# Patient Record
Sex: Female | Born: 1995 | Race: White | Hispanic: No | Marital: Single | Smoking: Never smoker
Health system: Southern US, Community
[De-identification: ages and names within clinical notes are randomized; demographics above are authoritative.]

## PROBLEM LIST (undated history)

## (undated) DIAGNOSIS — A64 Unspecified sexually transmitted disease: Secondary | ICD-10-CM

## (undated) DIAGNOSIS — Z8619 Personal history of other infectious and parasitic diseases: Secondary | ICD-10-CM

## (undated) DIAGNOSIS — N946 Dysmenorrhea, unspecified: Secondary | ICD-10-CM

## (undated) HISTORY — DX: Dysmenorrhea, unspecified: N94.6

## (undated) HISTORY — DX: Personal history of other infectious and parasitic diseases: Z86.19

## (undated) HISTORY — DX: Unspecified sexually transmitted disease: A64

---
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
## 2015-05-27 DIAGNOSIS — Z3041 Encounter for surveillance of contraceptive pills: Secondary | ICD-10-CM | POA: Diagnosis not present

## 2015-05-27 DIAGNOSIS — Z6823 Body mass index (BMI) 23.0-23.9, adult: Secondary | ICD-10-CM | POA: Diagnosis not present

## 2015-05-27 DIAGNOSIS — Z01419 Encounter for gynecological examination (general) (routine) without abnormal findings: Secondary | ICD-10-CM | POA: Diagnosis not present

## 2015-08-22 DIAGNOSIS — K080 Exfoliation of teeth due to systemic causes: Secondary | ICD-10-CM | POA: Diagnosis not present

## 2015-08-26 DIAGNOSIS — K080 Exfoliation of teeth due to systemic causes: Secondary | ICD-10-CM | POA: Diagnosis not present

## 2016-01-19 DIAGNOSIS — H6503 Acute serous otitis media, bilateral: Secondary | ICD-10-CM | POA: Diagnosis not present

## 2016-01-19 DIAGNOSIS — J029 Acute pharyngitis, unspecified: Secondary | ICD-10-CM | POA: Diagnosis not present

## 2016-01-19 DIAGNOSIS — B9789 Other viral agents as the cause of diseases classified elsewhere: Secondary | ICD-10-CM | POA: Diagnosis not present

## 2016-01-19 DIAGNOSIS — J069 Acute upper respiratory infection, unspecified: Secondary | ICD-10-CM | POA: Diagnosis not present

## 2016-02-23 DIAGNOSIS — K080 Exfoliation of teeth due to systemic causes: Secondary | ICD-10-CM | POA: Diagnosis not present

## 2016-06-18 DIAGNOSIS — Z1151 Encounter for screening for human papillomavirus (HPV): Secondary | ICD-10-CM | POA: Diagnosis not present

## 2016-06-18 DIAGNOSIS — A749 Chlamydial infection, unspecified: Secondary | ICD-10-CM | POA: Insufficient documentation

## 2016-06-18 DIAGNOSIS — Z3041 Encounter for surveillance of contraceptive pills: Secondary | ICD-10-CM | POA: Diagnosis not present

## 2016-06-18 DIAGNOSIS — Z1389 Encounter for screening for other disorder: Secondary | ICD-10-CM | POA: Diagnosis not present

## 2016-06-18 DIAGNOSIS — Z6822 Body mass index (BMI) 22.0-22.9, adult: Secondary | ICD-10-CM | POA: Diagnosis not present

## 2016-06-18 DIAGNOSIS — Z113 Encounter for screening for infections with a predominantly sexual mode of transmission: Secondary | ICD-10-CM | POA: Diagnosis not present

## 2016-06-18 DIAGNOSIS — Z130 Encounter for screening for diseases of the blood and blood-forming organs and certain disorders involving the immune mechanism: Secondary | ICD-10-CM | POA: Diagnosis not present

## 2016-06-18 DIAGNOSIS — Z124 Encounter for screening for malignant neoplasm of cervix: Secondary | ICD-10-CM | POA: Diagnosis not present

## 2016-06-18 DIAGNOSIS — Z01419 Encounter for gynecological examination (general) (routine) without abnormal findings: Secondary | ICD-10-CM | POA: Diagnosis not present

## 2016-06-18 LAB — HM PAP SMEAR: HM Pap smear: NEGATIVE

## 2016-06-26 DIAGNOSIS — D225 Melanocytic nevi of trunk: Secondary | ICD-10-CM | POA: Diagnosis not present

## 2016-06-26 DIAGNOSIS — D2371 Other benign neoplasm of skin of right lower limb, including hip: Secondary | ICD-10-CM | POA: Diagnosis not present

## 2016-06-26 DIAGNOSIS — D235 Other benign neoplasm of skin of trunk: Secondary | ICD-10-CM | POA: Diagnosis not present

## 2016-06-26 DIAGNOSIS — D485 Neoplasm of uncertain behavior of skin: Secondary | ICD-10-CM | POA: Diagnosis not present

## 2016-07-04 DIAGNOSIS — R229 Localized swelling, mass and lump, unspecified: Secondary | ICD-10-CM | POA: Diagnosis not present

## 2016-07-04 DIAGNOSIS — B078 Other viral warts: Secondary | ICD-10-CM | POA: Diagnosis not present

## 2016-07-06 ENCOUNTER — Other Ambulatory Visit (HOSPITAL_COMMUNITY)
Admission: RE | Admit: 2016-07-06 | Discharge: 2016-07-06 | Disposition: A | Discharge status: Home / Self Care | Payer: Federal, State, Local not specified - PPO | Source: Ambulatory Visit | Attending: Family Medicine | Admitting: Family Medicine

## 2016-07-06 ENCOUNTER — Encounter: Payer: Self-pay | Admitting: Family Medicine

## 2016-07-06 ENCOUNTER — Ambulatory Visit (INDEPENDENT_AMBULATORY_CARE_PROVIDER_SITE_OTHER): Payer: Federal, State, Local not specified - PPO | Admitting: Family Medicine

## 2016-07-06 VITALS — BP 124/78 | HR 67 | Temp 98.2°F | Ht 65.75 in | Wt 132.8 lb

## 2016-07-06 DIAGNOSIS — E049 Nontoxic goiter, unspecified: Secondary | ICD-10-CM | POA: Diagnosis not present

## 2016-07-06 DIAGNOSIS — R599 Enlarged lymph nodes, unspecified: Secondary | ICD-10-CM

## 2016-07-06 DIAGNOSIS — A749 Chlamydial infection, unspecified: Secondary | ICD-10-CM

## 2016-07-06 DIAGNOSIS — Z7689 Persons encountering health services in other specified circumstances: Secondary | ICD-10-CM | POA: Diagnosis not present

## 2016-07-06 DIAGNOSIS — Z8619 Personal history of other infectious and parasitic diseases: Secondary | ICD-10-CM | POA: Diagnosis not present

## 2016-07-06 LAB — CBC WITH DIFFERENTIAL/PLATELET
BASOS PCT: 0.5 % (ref 0.0–3.0)
Basophils Absolute: 0 10*3/uL (ref 0.0–0.1)
EOS ABS: 0.1 10*3/uL (ref 0.0–0.7)
EOS PCT: 1.3 % (ref 0.0–5.0)
HEMATOCRIT: 42.4 % (ref 36.0–46.0)
HEMOGLOBIN: 14.7 g/dL (ref 12.0–15.0)
LYMPHS PCT: 29.7 % (ref 12.0–46.0)
Lymphs Abs: 1.4 10*3/uL (ref 0.7–4.0)
MCHC: 34.6 g/dL (ref 30.0–36.0)
MCV: 90.9 fl (ref 78.0–100.0)
MONOS PCT: 7.7 % (ref 3.0–12.0)
Monocytes Absolute: 0.4 10*3/uL (ref 0.1–1.0)
NEUTROS ABS: 2.9 10*3/uL (ref 1.4–7.7)
Neutrophils Relative %: 60.8 % (ref 43.0–77.0)
PLATELETS: 196 10*3/uL (ref 150.0–400.0)
RBC: 4.67 Mil/uL (ref 3.87–5.11)
RDW: 13.3 % (ref 11.5–15.5)
WBC: 4.7 10*3/uL (ref 4.0–10.5)

## 2016-07-06 LAB — T3, FREE: T3, Free: 3.6 pg/mL (ref 2.3–4.2)

## 2016-07-06 LAB — TSH: TSH: 1.75 u[IU]/mL (ref 0.35–4.50)

## 2016-07-06 LAB — T4, FREE: Free T4: 0.87 ng/dL (ref 0.60–1.60)

## 2016-07-06 NOTE — Patient Instructions (Signed)
It was a pleasure to meet you today! I look forward to partnering with you for your health care needs  Please schedule a follow up lab only visit for 2 weeks  I will notify you when lab results are back  If you have not heard from us about scheduling your ultrasound in 2 business days, please call the office

## 2016-07-06 NOTE — Progress Notes (Signed)
Subjective:    Patient ID: Christine Griffin, female    DOB: 1995-05-05, 21 y.o.   MRN: [id number]  HPI This is a 21 yo female who presents today to establish care. She is from [place], [place]. She recently graduated from [employer] and works for the [employer]. She has had some stress with moving to a new city and with recent health concerns. Has made some friends. Sleeping well.   Gyn/chlamydia- She was seen by gyn 06/18/16 and had negative PAP, negative urine dip, Hgb 13.6, negative gonorrhea/trich, positive chlamydia. She was treated with azithromycin 1000 mg x 1 and continued on OCP, Daysee. Declined testing for HIV/RPR at that time, would like testing today. Told her partner, has not seen him since. Immunizations, including HPV, up to date.   Thyroid- Was noted to have enlarged thyroid gland by gyn. Mother was diagnosed several years ago with thyroid cancer. Christine Griffin denies any weight change, difficulty swallowing, change in energy level, change in bowel habits.   Enlarged lymph node- she noticed an enlarged lymph node behind left ear, this has resolved. Thinks she has enlarged node under left side of jaw.   Regular dental care Exercises regularly  No past medical history on file. Past Surgical History:  Procedure Laterality Date  . KNEE SURGERY Right 2013   Family History  Problem Relation Age of Onset  . Cancer Mother        Thyroid   . Cancer Father        Skin   Social History  Substance Use Topics  . Smoking status: Never Smoker  . Smokeless tobacco: Never Used  . Alcohol use Yes     Comment: social         Review of Systems Per HPI    Objective:   Physical Exam  Constitutional: She is oriented to person, place, and time. She appears well-developed and well-nourished. No distress.  HENT:  Head: Normocephalic and atraumatic.  Mouth/Throat: Oropharynx is clear and moist.  Eyes: Conjunctivae are normal.  Neck: Normal range of motion. Neck  supple. Thyromegaly: right sided fullness visualized, no nodule/mass palpated, thyroid moves normally with swallowing.    Cardiovascular: Normal rate, regular rhythm and normal heart sounds.   Pulmonary/Chest: Effort normal and breath sounds normal.  Neurological: She is alert and oriented to person, place, and time.  Skin: Skin is warm and dry. She is not diaphoretic.  Psychiatric: She has a normal mood and affect. Her behavior is normal. Judgment and thought content normal.  Vitals reviewed.     BP 124/78 (BP Location: Right Arm, Patient Position: Sitting, Cuff Size: Normal)   Pulse 67   Temp 98.2 F (36.8 C) (Oral)   Ht 5' 5.75" (1.67 m)   Wt 132 lb 12.8 oz (60.2 kg)   LMP 07/01/2016 (Exact Date)   SpO2 99%   BMI 21.60 kg/m      Assessment & Plan:  1. Encounter to establish care - Discussed and encouraged healthy lifestyle choices- adequate sleep, regular exercise, stress management and healthy food choices.   2. Enlarged thyroid - given family history and visualized asymmetry, will get [place] - CBC with Differential/Platelet - TSH - T4, free - T3, free - US THYROID; Future  3. Chlamydia - HIV antibody - RPR - Urine cytology ancillary only; Future- recheck in 2 weeks  4. Swollen lymph nodes - likely reactive, discussed RTC precautions - CBC with Differential/Platelet   Olean Ree, FNP-BC  [facility] [place], [facility]  07/06/2016 9:06 AM

## 2016-07-07 LAB — RPR

## 2016-07-07 LAB — HIV ANTIBODY (ROUTINE TESTING W REFLEX): HIV 1&2 Ab, 4th Generation: NONREACTIVE

## 2016-07-09 LAB — URINE CYTOLOGY ANCILLARY ONLY
Chlamydia: NEGATIVE
NEISSERIA GONORRHEA: NEGATIVE
TRICH (WINDOWPATH): NEGATIVE

## 2016-07-18 ENCOUNTER — Ambulatory Visit
Admission: RE | Admit: 2016-07-18 | Discharge: 2016-07-18 | Disposition: A | Discharge status: Home / Self Care | Payer: Federal, State, Local not specified - PPO | Source: Ambulatory Visit | Attending: Family Medicine | Admitting: Family Medicine

## 2016-07-18 DIAGNOSIS — E049 Nontoxic goiter, unspecified: Secondary | ICD-10-CM

## 2016-07-18 DIAGNOSIS — E042 Nontoxic multinodular goiter: Secondary | ICD-10-CM | POA: Diagnosis not present

## 2016-07-20 ENCOUNTER — Other Ambulatory Visit (HOSPITAL_COMMUNITY)
Admission: RE | Admit: 2016-07-20 | Discharge: 2016-07-20 | Disposition: A | Discharge status: Home / Self Care | Payer: Federal, State, Local not specified - PPO | Source: Ambulatory Visit | Attending: Family Medicine | Admitting: Family Medicine

## 2016-07-20 ENCOUNTER — Other Ambulatory Visit: Payer: Federal, State, Local not specified - PPO

## 2016-07-20 ENCOUNTER — Other Ambulatory Visit: Payer: Self-pay | Admitting: Family Medicine

## 2016-07-20 DIAGNOSIS — Z8619 Personal history of other infectious and parasitic diseases: Secondary | ICD-10-CM | POA: Diagnosis not present

## 2016-07-20 DIAGNOSIS — Z113 Encounter for screening for infections with a predominantly sexual mode of transmission: Secondary | ICD-10-CM

## 2016-07-23 LAB — URINE CYTOLOGY ANCILLARY ONLY
Chlamydia: NEGATIVE
NEISSERIA GONORRHEA: NEGATIVE
TRICH (WINDOWPATH): NEGATIVE

## 2016-08-20 ENCOUNTER — Ambulatory Visit (INDEPENDENT_AMBULATORY_CARE_PROVIDER_SITE_OTHER): Payer: Federal, State, Local not specified - PPO | Admitting: Family Medicine

## 2016-08-20 ENCOUNTER — Encounter: Payer: Self-pay | Admitting: Family Medicine

## 2016-08-20 ENCOUNTER — Other Ambulatory Visit (HOSPITAL_COMMUNITY)
Admission: RE | Admit: 2016-08-20 | Discharge: 2016-08-20 | Disposition: A | Discharge status: Home / Self Care | Payer: Federal, State, Local not specified - PPO | Source: Ambulatory Visit | Attending: Family Medicine | Admitting: Family Medicine

## 2016-08-20 VITALS — BP 124/76 | HR 75 | Temp 98.5°F | Wt 135.2 lb

## 2016-08-20 DIAGNOSIS — A549 Gonococcal infection, unspecified: Secondary | ICD-10-CM

## 2016-08-20 DIAGNOSIS — Z113 Encounter for screening for infections with a predominantly sexual mode of transmission: Secondary | ICD-10-CM

## 2016-08-20 DIAGNOSIS — B373 Candidiasis of vulva and vagina: Secondary | ICD-10-CM | POA: Diagnosis not present

## 2016-08-20 DIAGNOSIS — N898 Other specified noninflammatory disorders of vagina: Secondary | ICD-10-CM | POA: Insufficient documentation

## 2016-08-20 DIAGNOSIS — Z9189 Other specified personal risk factors, not elsewhere classified: Secondary | ICD-10-CM | POA: Diagnosis not present

## 2016-08-20 NOTE — Patient Instructions (Addendum)
I will notify you of results The following was added for the patient after results were received.    You also had some vaginal yeast- after a couple of days, you can use an over the counter 3 day vaginal treatment.    Gonorrhea Gonorrhea is a sexually transmitted disease (STD) that can affect both men and women. If left untreated, this infection can:  Damage the female or female organs.  Cause women and men to be unable to have children (be sterile).  Harm a fetus if an infected woman is pregnant.  It is important to get treatment for gonorrhea as soon as possible. It is also necessary for all of your sexual partners to be tested for the infection. What are the causes? This condition is caused by bacteria called Neisseria gonorrhoeae. The infection is spread from person to person through sexual contact, including oral, anal, and vaginal sex. A newborn can contract the infection from his or her mother during birth. What increases the risk? The following factors may make you more likely to develop this condition:  Being a woman who is younger than 23 years of age and who is sexually active.  Being a woman 58 years of age or older who has: ? A new sex partner. ? More than one sex partner. ? A sex partner who has an STD.  Being a man who has: ? A new sex partner. ? More than one sex partner. ? A sex partner who has an STD.  Using condoms inconsistently.  Currently having, or having previously had, an STD.  Exchanging sex for money or drugs.  What are the signs or symptoms? Some people do not have any symptoms. If you do have symptoms, they may be different for females and males. For females  Pain in the lower abdomen.  Abnormal vaginal discharge. The discharge may be cloudy, thick, or yellow-green in color.  Bleeding between periods.  Painful sex.  Burning or itching in and around the vagina.  Pain or burning when urinating.  Irritation, pain, bleeding, or discharge  from the rectum. This may occur if the infection was spread by anal sex.  Sore throat or swollen lymph nodes in the neck. This may occur if the infection was spread by oral sex. For males  Abnormal discharge from the penis. This discharge may be cloudy, thick, or yellow-green in color.  Pain or burning during urination.  Pain or swelling in the testicles.  Irritation, pain, bleeding, or discharge from the rectum. This may occur if the infection was spread by anal sex.  Sore throat, fever, or swollen lymph nodes in the neck. This may occur if the infection was spread by oral sex. How is this diagnosed? This condition is diagnosed based on:  A physical exam.  A sample of discharge that is examined under a microscope to look for the bacteria. The discharge may be taken from the urethra, cervix, throat, or rectum.  Urine tests.  Not all of test results will be available during your visit. How is this treated? This condition is treated with antibiotic medicines. It is important for treatment to begin as soon as possible. Early treatment may prevent some problems from developing. Do not have sex during treatment. Avoid all types of sexual activity for 7 days after treatment is complete and until any sex partners have been treated. Follow these instructions at home:  Take over-the-counter and prescription medicines only as told by your health care provider.  Take your antibiotic  medicine as told by your health care provider. Do not stop taking the antibiotic even if you start to feel better.  Do not have sex until at least 7 days after you and your partner(s) have finished treatment and your health care provider says it is okay.  It is your responsibility to get your test results. Ask your health care provider, or the department performing the test, when your results will be ready.  If you test positive for gonorrhea, inform your recent sexual partners. This includes any oral, anal, or  vaginal sex partners. They need to be checked for gonorrhea even if they do not have symptoms. They may need treatment, even if they test negative for gonorrhea.  Keep all follow-up visits as told by your health care provider. This is important. How is this prevented?  Use latex condoms correctly every time you have sexual intercourse.  Ask if your sexual partner has been tested for STDs and had negative results.  Avoid having multiple sexual partners. Contact a health care provider if:  You develop a bad reaction to the medicine you were prescribed. This may include: ? A rash. ? Nausea. ? Vomiting. ? Diarrhea.  Your symptoms do not get better after a few days of taking antibiotics.  Your symptoms get worse.  You develop new symptoms.  Your pain gets worse.  You have a fever.  You develop pain, itching, or discharge around the eyes. Get help right away if:  You feel dizzy or faint.  You have trouble breathing or have shortness of breath.  You develop an irregular heartbeat.  You have severe abdominal pain with or without shoulder pain.  You develop any bumps or sores (lesions) on your skin.  You develop warmth, redness, pain, or swelling around your joints, such as the knee. Summary  Gonorrhea is an STDthat can affect both men and women.  This condition is caused by bacteria called Neisseria gonorrhoeae. The infection is spread from person to person, usually through sexual contact, including oral, anal, and vaginal sex.  Symptoms vary between males and females. Generally, they include abnormal discharge and burning during urination. Women may also experience painful sex, itching around the vagina, and bleeding between menstrual periods. Men may also experience swelling of the testicles.  This condition is treated with antibiotic medicines. Do not have sex until at least 7 days after completing antibiotic treatment.  If left untreated, gonorrhea can have serious  side effects and complications. This information is not intended to replace advice given to you by your health care provider. Make sure you discuss any questions you have with your health care provider. Document Released: 12/23/1999 Document Revised: 11/25/2015 Document Reviewed: 11/25/2015 Elsevier Interactive Patient Education  2017 [employer]

## 2016-08-20 NOTE — Progress Notes (Signed)
   Subjective:    Patient ID: Christine Griffin, female    DOB: 1995/02/24, 21 y.o.   MRN: [id number]  HPI This is a 21 yo female who presents today requesting testing for STI. She had had unprotected intercourse 2-3 weeks ago. Has noticed some irregular discharge in the last couple of days. Yellowish discharge. No abdominal pain, no dysuria, no fever/chills.  Was diagnosed and treated for chlamydia  6/18, follow up testing was negative. Same partner. He told her he had been tested and was negative and did not undergo treatment. She reports that they started using a condom, but "it was going anywhere," so her partner removed it.    No past medical history on file. Past Surgical History:  Procedure Laterality Date  . KNEE SURGERY Right 2013   Family History  Problem Relation Age of Onset  . Cancer Mother        Thyroid   . Cancer Father        Skin   Social History  Substance Use Topics  . Smoking status: Never Smoker  . Smokeless tobacco: Never Used  . Alcohol use Yes     Comment: social       Review of Systems    per HPI Objective:   Physical Exam  Constitutional: She is oriented to person, place, and time. She appears well-developed and well-nourished.  HENT:  Head: Normocephalic and atraumatic.  Eyes: Conjunctivae are normal.  Cardiovascular: Normal rate.   Pulmonary/Chest: Effort normal.  Genitourinary: Pelvic exam was performed with patient supine. There is no rash, tenderness, lesion or injury on the right labia. There is no rash, tenderness, lesion or injury on the left labia. Cervix exhibits discharge (thick, white). Vaginal discharge found.  Neurological: She is alert and oriented to person, place, and time.  Skin: Skin is warm and dry.  Psychiatric: She has a normal mood and affect. Her behavior is normal. Judgment and thought content normal.      BP 124/76 (BP Location: Right Arm, Patient Position: Sitting, Cuff Size: Normal)   Pulse 75   Temp 98.5 F (36.9  C) (Oral)   Wt 135 lb 3.2 oz (61.3 kg)   LMP 07/10/2016   SpO2 99%   BMI 21.99 kg/m  Wt Readings from Last 3 Encounters:  08/20/16 135 lb 3.2 oz (61.3 kg)  07/06/16 132 lb 12.8 oz (60.2 kg)       Assessment & Plan:  1. Vaginal discharge - Cervicovaginal ancillary only  2. At risk for sexually transmitted disease due to unprotected sex - Cervicovaginal ancillary only - encouraged condom use every time  Olean Reeeborah Gessner, FNP-BC  [facility] [place], [facility]  08/20/2016 8:22 AM

## 2016-08-22 ENCOUNTER — Other Ambulatory Visit (INDEPENDENT_AMBULATORY_CARE_PROVIDER_SITE_OTHER): Payer: Federal, State, Local not specified - PPO

## 2016-08-22 ENCOUNTER — Ambulatory Visit (INDEPENDENT_AMBULATORY_CARE_PROVIDER_SITE_OTHER): Payer: Federal, State, Local not specified - PPO | Admitting: Emergency Medicine

## 2016-08-22 VITALS — HR 81 | Wt 133.2 lb

## 2016-08-22 DIAGNOSIS — A549 Gonococcal infection, unspecified: Secondary | ICD-10-CM | POA: Diagnosis not present

## 2016-08-22 DIAGNOSIS — Z113 Encounter for screening for infections with a predominantly sexual mode of transmission: Secondary | ICD-10-CM | POA: Diagnosis not present

## 2016-08-22 DIAGNOSIS — A749 Chlamydial infection, unspecified: Secondary | ICD-10-CM

## 2016-08-22 LAB — CERVICOVAGINAL ANCILLARY ONLY
BACTERIAL VAGINITIS: NEGATIVE
CANDIDA VAGINITIS: POSITIVE — AB
CHLAMYDIA, DNA PROBE: NEGATIVE
NEISSERIA GONORRHEA: POSITIVE — AB
Trichomonas: NEGATIVE

## 2016-08-22 MED ORDER — CEFTRIAXONE SODIUM 250 MG IJ SOLR
250.0000 mg | Freq: Once | INTRAMUSCULAR | Status: AC
  Administered 2016-08-22: 250 mg via INTRAMUSCULAR

## 2016-08-22 MED ORDER — AZITHROMYCIN 250 MG PO TABS
1000.0000 mg | ORAL_TABLET | Freq: Once | ORAL | Status: AC
  Administered 2016-08-22: 1000 mg via ORAL

## 2016-08-22 NOTE — Addendum Note (Signed)
Addended by: Olean ReeGESSNER, DEBORAH B on: 08/22/2016 10:21 AM   Modules accepted: Orders

## 2016-08-23 DIAGNOSIS — K080 Exfoliation of teeth due to systemic causes: Secondary | ICD-10-CM | POA: Diagnosis not present

## 2016-08-23 LAB — RPR

## 2016-08-23 LAB — HIV ANTIBODY (ROUTINE TESTING W REFLEX): HIV: NONREACTIVE

## 2016-10-23 ENCOUNTER — Encounter: Payer: Self-pay | Admitting: Physician Assistant

## 2016-10-24 ENCOUNTER — Other Ambulatory Visit (HOSPITAL_COMMUNITY)
Admission: RE | Admit: 2016-10-24 | Discharge: 2016-10-24 | Disposition: A | Discharge status: Home / Self Care | Payer: Federal, State, Local not specified - PPO | Source: Ambulatory Visit | Attending: Physician Assistant | Admitting: Physician Assistant

## 2016-10-24 ENCOUNTER — Encounter: Payer: Self-pay | Admitting: Physician Assistant

## 2016-10-24 ENCOUNTER — Ambulatory Visit (INDEPENDENT_AMBULATORY_CARE_PROVIDER_SITE_OTHER): Payer: Federal, State, Local not specified - PPO | Admitting: Physician Assistant

## 2016-10-24 VITALS — BP 120/70 | HR 73 | Temp 98.5°F | Ht 65.75 in | Wt 133.0 lb

## 2016-10-24 DIAGNOSIS — N898 Other specified noninflammatory disorders of vagina: Secondary | ICD-10-CM | POA: Insufficient documentation

## 2016-10-24 LAB — POCT URINALYSIS DIPSTICK
BILIRUBIN UA: NEGATIVE
Blood, UA: NEGATIVE
GLUCOSE UA: NEGATIVE
Ketones, UA: NEGATIVE
LEUKOCYTES UA: NEGATIVE
NITRITE UA: NEGATIVE
Protein, UA: NEGATIVE
Spec Grav, UA: 1.01 (ref 1.010–1.025)
Urobilinogen, UA: 0.2 E.U./dL
pH, UA: 6 (ref 5.0–8.0)

## 2016-10-24 LAB — POCT URINE PREGNANCY: Preg Test, Ur: NEGATIVE

## 2016-10-24 MED ORDER — FLUCONAZOLE 150 MG PO TABS: 150.0000 mg | ORAL_TABLET | Freq: Once | ORAL | 0 refills | Status: AC

## 2016-10-24 NOTE — Patient Instructions (Signed)
It was great to meet you.  I recommend avoiding Summer's Eve products. Please use regular soap and water to clean vaginal area.  I am treating you for a yeast infection, and we will be in touch regarding the rest of your lab results.

## 2016-10-24 NOTE — Progress Notes (Signed)
Christine Griffin is a 21 y.o. female here to Establish Care and to discuss vaginal discharge.  I acted as a Neurosurgeonscribe for Energy East CorporationSamantha Yurianna Tusing, PA-C Corky Mullonna Orphanos, LPN  History of Present Illness:   Chief Complaint  Patient presents with  . Establish Care    BC/BS, transfer from [facility]  . Vaginal Discharge    Acute Concerns: White vaginal discharge -- patient endorses white vaginal discharge x a few days. Of notes, she tested positive for candida and gonorrhea on 08/20/16 and was treated with rocephin and azithromycin by prior PCP. She states that since that time, she has been abstinent. Uses Daysee oral contraceptive, which is a 4975-month birth control. Denies fevers, chills. She does state that over the past month and half she has been using Summer's Eve gel cleanser. Patient's last menstrual period was 09/16/2016. Denies urinary symptoms or hx of UTI.  Health Maintenance: Weight -- Weight: 133 lb (60.3 kg)   [facility]/06/2016  Decreased Interest 0  PHQ - 2 Score 0    No flowsheet data found.   History reviewed. No pertinent past medical history.   Social History   Social History  . Marital status: Single    Spouse name: N/A  . Number of children: N/A  . Years of education: N/A   Occupational History  . Not on file.   Social History Main Topics  . Smoking status: Never Smoker  . Smokeless tobacco: Never Used  . Alcohol use Yes     Comment: social   . Drug use: No  . Sexual activity: Yes    Birth control/ protection: Pill   Other Topics Concern  . Not on file   Social History Narrative   Single   Graduated from [employer] 2018   Works for [employer]   Plays soccer and exercises for recreation    Past Surgical History:  Procedure Laterality Date  . KNEE SURGERY Right 2013    Family History  Problem Relation Age of Onset  . Cancer Mother        Thyroid   . Cancer Father        Skin    No Known  Allergies   Current Medications:   Current Outpatient Prescriptions:  .  Levonorgestrel-Ethinyl Estradiol (DAYSEE) 0.15-0.03 &0.01 MG tablet, Daysee 0.15 mg-30 mcg (84)/10 mcg(7) tablets,3 month dose pack  Take 1 tablet every day by oral route as directed for 91 days., Disp: , Rfl:  .  fluconazole (DIFLUCAN) 150 MG tablet, Take 1 tablet (150 mg total) by mouth once., Disp: 1 tablet, Rfl: 0   Review of Systems:   ROS  Negative unless otherwise specified in HPI.  Vitals:   Vitals:   10/24/16 0901  BP: 120/70  Pulse: 73  Temp: 98.5 F (36.9 C)  TempSrc: Oral  SpO2: 100%  Weight: 133 lb (60.3 kg)  Height: 5' 5.75" (1.67 m)     Body mass index is 21.63 kg/m.  Physical Exam:   Physical Exam  Constitutional: She appears well-developed. She is cooperative.  Non-toxic appearance. She does not have a sickly appearance. She does not appear ill. No distress.  Cardiovascular: Normal rate, regular rhythm, S1 normal, S2 normal, normal heart sounds and normal pulses.   No LE edema  Pulmonary/Chest: Effort normal and breath sounds normal.  Genitourinary: There is no rash, tenderness or lesion on the right labia. There is no rash, tenderness or lesion on the left labia. Cervix exhibits no  motion tenderness. No erythema, tenderness or bleeding in the vagina. Vaginal discharge (white, clumpy) found.  Neurological: She is alert. GCS eye subscore is 4. GCS verbal subscore is 5. GCS motor subscore is 6.  Skin: Skin is warm, dry and intact.  Psychiatric: She has a normal mood and affect. Her speech is normal and behavior is normal.  Nursing note and vitals reviewed.  Results for orders placed or performed in visit on 10/24/16  POCT urine pregnancy  Result Value Ref Range   Preg Test, Ur Negative Negative  POCT urinalysis dipstick  Result Value Ref Range   Color, UA Yellow    Clarity, UA Clear    Glucose, UA Negative    Bilirubin, UA Negative    Ketones, UA Negative    Spec Grav, UA  1.010 1.010 - 1.025   Blood, UA Negative    pH, UA 6.0 5.0 - 8.0   Protein, UA Negative    Urobilinogen, UA 0.2 0.2 or 1.0 E.U./dL   Nitrite, UA Negative    Leukocytes, UA Negative Negative    Assessment and Plan:    Christine Griffin was seen today for establish care and vaginal discharge.  Diagnoses and all orders for this visit:  Vaginal discharge Urine unremarkable. Urine pregnancy negative. She is agreeable to STI testing. Exam is suspicious for yeast infection -- will treat with 1 time dose of diflucan. Discussed need to stop use of Summer's Eve and use regular soap and water. We will follow-up with a plan regarding further treatment based on lab results. Avoid sex until results have returned. -     POCT urine pregnancy -     Cervicovaginal ancillary only -     POCT urinalysis dipstick  Other orders -     fluconazole (DIFLUCAN) 150 MG tablet; Take 1 tablet (150 mg total) by mouth once.  . Reviewed expectations re: course of current medical issues. . Discussed self-management of symptoms. . Outlined signs and symptoms indicating need for more acute intervention. . Patient verbalized understanding and all questions were answered. . See orders for this visit as documented in the electronic medical record. . Patient received an After-Visit Summary.   CMA or LPN served as scribe during this visit. History, Physical, and Plan performed by medical provider. Documentation and orders reviewed and attested to.   Jarold Motto, PA-C

## 2016-10-25 LAB — CERVICOVAGINAL ANCILLARY ONLY
BACTERIAL VAGINITIS: NEGATIVE
CANDIDA VAGINITIS: POSITIVE — AB
Chlamydia: NEGATIVE
NEISSERIA GONORRHEA: NEGATIVE
Trichomonas: NEGATIVE

## 2016-10-29 ENCOUNTER — Encounter: Payer: Self-pay | Admitting: Physician Assistant

## 2016-12-03 ENCOUNTER — Encounter: Payer: Self-pay | Admitting: Physician Assistant

## 2016-12-04 MED ORDER — LEVONORGEST-ETH ESTRAD 91-DAY 0.15-0.03 &0.01 MG PO TABS: ORAL_TABLET | ORAL | 1 refills | Status: DC

## 2017-01-10 DIAGNOSIS — D485 Neoplasm of uncertain behavior of skin: Secondary | ICD-10-CM | POA: Diagnosis not present

## 2017-01-10 DIAGNOSIS — D224 Melanocytic nevi of scalp and neck: Secondary | ICD-10-CM | POA: Diagnosis not present

## 2017-01-10 DIAGNOSIS — D2271 Melanocytic nevi of right lower limb, including hip: Secondary | ICD-10-CM | POA: Diagnosis not present

## 2017-01-10 DIAGNOSIS — D2262 Melanocytic nevi of left upper limb, including shoulder: Secondary | ICD-10-CM | POA: Diagnosis not present

## 2017-01-10 DIAGNOSIS — L814 Other melanin hyperpigmentation: Secondary | ICD-10-CM | POA: Diagnosis not present

## 2017-01-10 DIAGNOSIS — D225 Melanocytic nevi of trunk: Secondary | ICD-10-CM | POA: Diagnosis not present

## 2017-01-10 DIAGNOSIS — D2261 Melanocytic nevi of right upper limb, including shoulder: Secondary | ICD-10-CM | POA: Diagnosis not present

## 2017-02-14 DIAGNOSIS — J069 Acute upper respiratory infection, unspecified: Secondary | ICD-10-CM | POA: Diagnosis not present

## 2017-03-18 DIAGNOSIS — K080 Exfoliation of teeth due to systemic causes: Secondary | ICD-10-CM | POA: Diagnosis not present

## 2017-03-26 ENCOUNTER — Encounter: Payer: Self-pay | Admitting: Physician Assistant

## 2017-05-31 ENCOUNTER — Other Ambulatory Visit: Payer: Self-pay | Admitting: Family Medicine

## 2017-05-31 NOTE — Telephone Encounter (Signed)
Will she need appointment for refill

## 2017-06-04 NOTE — Telephone Encounter (Signed)
Ok for refill but will need appointment for further refills.  Lelon Mast

## 2017-06-20 ENCOUNTER — Ambulatory Visit (INDEPENDENT_AMBULATORY_CARE_PROVIDER_SITE_OTHER): Payer: Federal, State, Local not specified - PPO | Admitting: Physician Assistant

## 2017-06-20 ENCOUNTER — Other Ambulatory Visit (HOSPITAL_COMMUNITY)
Admission: RE | Admit: 2017-06-20 | Discharge: 2017-06-20 | Disposition: A | Discharge status: Home / Self Care | Payer: Federal, State, Local not specified - PPO | Source: Ambulatory Visit | Attending: Physician Assistant | Admitting: Physician Assistant

## 2017-06-20 ENCOUNTER — Encounter: Payer: Self-pay | Admitting: Physician Assistant

## 2017-06-20 VITALS — BP 110/70 | HR 74 | Temp 98.3°F | Ht 65.75 in | Wt 135.6 lb

## 2017-06-20 DIAGNOSIS — R42 Dizziness and giddiness: Secondary | ICD-10-CM | POA: Insufficient documentation

## 2017-06-20 DIAGNOSIS — E049 Nontoxic goiter, unspecified: Secondary | ICD-10-CM | POA: Insufficient documentation

## 2017-06-20 DIAGNOSIS — Z0001 Encounter for general adult medical examination with abnormal findings: Secondary | ICD-10-CM

## 2017-06-20 DIAGNOSIS — Z113 Encounter for screening for infections with a predominantly sexual mode of transmission: Secondary | ICD-10-CM | POA: Insufficient documentation

## 2017-06-20 DIAGNOSIS — Z136 Encounter for screening for cardiovascular disorders: Secondary | ICD-10-CM | POA: Diagnosis not present

## 2017-06-20 DIAGNOSIS — R0781 Pleurodynia: Secondary | ICD-10-CM

## 2017-06-20 DIAGNOSIS — Z13220 Encounter for screening for lipoid disorders: Secondary | ICD-10-CM | POA: Diagnosis not present

## 2017-06-20 DIAGNOSIS — Z3041 Encounter for surveillance of contraceptive pills: Secondary | ICD-10-CM | POA: Diagnosis not present

## 2017-06-20 LAB — COMPREHENSIVE METABOLIC PANEL
ALBUMIN: 4.8 g/dL (ref 3.5–5.2)
ALK PHOS: 45 U/L (ref 39–117)
ALT: 19 U/L (ref 0–35)
AST: 22 U/L (ref 0–37)
BUN: 18 mg/dL (ref 6–23)
CO2: 23 mEq/L (ref 19–32)
Calcium: 9.9 mg/dL (ref 8.4–10.5)
Chloride: 103 mEq/L (ref 96–112)
Creatinine, Ser: 1.02 mg/dL (ref 0.40–1.20)
GFR: 70.59 mL/min (ref >60.00)
Glucose, Bld: 86 mg/dL (ref 70–99)
POTASSIUM: 3.8 meq/L (ref 3.5–5.1)
SODIUM: 136 meq/L (ref 135–145)
TOTAL PROTEIN: 8.3 g/dL (ref 6.0–8.3)
Total Bilirubin: 0.8 mg/dL (ref 0.2–1.2)

## 2017-06-20 LAB — CBC
HEMATOCRIT: 45.1 % (ref 36.0–46.0)
Hemoglobin: 15.4 g/dL — ABNORMAL HIGH (ref 12.0–15.0)
MCHC: 34.2 g/dL (ref 30.0–36.0)
MCV: 92 fl (ref 78.0–100.0)
Platelets: 183 10*3/uL (ref 150.0–400.0)
RBC: 4.9 Mil/uL (ref 3.87–5.11)
RDW: 13.6 % (ref 11.5–15.5)
WBC: 7.8 10*3/uL (ref 4.0–10.5)

## 2017-06-20 LAB — POCT URINE PREGNANCY: PREG TEST UR: NEGATIVE

## 2017-06-20 LAB — LIPID PANEL
CHOL/HDL RATIO: 3
Cholesterol: 163 mg/dL (ref 0–200)
HDL: 57.6 mg/dL (ref >39.00)
LDL Cholesterol: 91 mg/dL (ref 0–99)
NONHDL: 105.66
TRIGLYCERIDES: 72 mg/dL (ref 0.0–149.0)
VLDL: 14.4 mg/dL (ref 0.0–40.0)

## 2017-06-20 LAB — TSH: TSH: 1.67 u[IU]/mL (ref 0.35–4.50)

## 2017-06-20 LAB — T4, FREE: Free T4: 0.82 ng/dL (ref 0.60–1.60)

## 2017-06-20 MED ORDER — MELOXICAM 15 MG PO TABS: 15.0000 mg | ORAL_TABLET | Freq: Every day | ORAL | 0 refills | Status: DC

## 2017-06-20 NOTE — Patient Instructions (Addendum)
It was great to see you.  For your muscle pain, avoid activities that worsen your pain. Take Mobic daily. If symptoms persist after two weeks, please call our office and schedule appointment with Dr. Teresa Coombs.  Continue to stay hydrated, if dizziness persists, please let us know.  Health Maintenance, Female Adopting a healthy lifestyle and getting preventive care can go a long way to promote health and wellness. Talk with your health care provider about what schedule of regular examinations is right for you. This is a good chance for you to check in with your provider about disease prevention and staying healthy. In between checkups, there are plenty of things you can do on your own. Experts have done a lot of research about which lifestyle changes and preventive measures are most likely to keep you healthy. Ask your health care provider for more information. Weight and diet Eat a healthy diet  Be sure to include plenty of vegetables, fruits, low-fat dairy products, and lean protein.  Do not eat a lot of foods high in solid fats, added sugars, or salt.  Get regular exercise. This is one of the most important things you can do for your health. ? Most adults should exercise for at least 150 minutes each week. The exercise should increase your heart rate and make you sweat (moderate-intensity exercise). ? Most adults should also do strengthening exercises at least twice a week. This is in addition to the moderate-intensity exercise.  Maintain a healthy weight  Body mass index (BMI) is a measurement that can be used to identify possible weight problems. It estimates body fat based on height and weight. Your health care provider can help determine your BMI and help you achieve or maintain a healthy weight.  For females 54 years of age and older: ? A BMI below 18.5 is considered underweight. ? A BMI of 18.5 to 24.9 is normal. ? A BMI of 25 to 29.9 is considered overweight. ? A BMI of 30  and above is considered obese.  Watch levels of cholesterol and blood lipids  You should start having your blood tested for lipids and cholesterol at 24 years of age, then have this test every 5 years.  You may need to have your cholesterol levels checked more often if: ? Your lipid or cholesterol levels are high. ? You are older than 23 years of age. ? You are at high risk for heart disease.  Cancer screening Lung Cancer  Lung cancer screening is recommended for adults 65-34 years old who are at high risk for lung cancer because of a history of smoking.  A yearly low-dose CT scan of the lungs is recommended for people who: ? Currently smoke. ? Have quit within the past 15 years. ? Have at least a 30-pack-year history of smoking. A pack year is smoking an average of one pack of cigarettes a day for 1 year.  Yearly screening should continue until it has been 15 years since you quit.  Yearly screening should stop if you develop a health problem that would prevent you from having lung cancer treatment.  Breast Cancer  Practice breast self-awareness. This means understanding how your breasts normally appear and feel.  It also means doing regular breast self-exams. Let your health care provider know about any changes, no matter how small.  If you are in your 20s or 30s, you should have a clinical breast exam (CBE) by a health care provider every 1-3 years as part of a  regular health exam.  If you are 40 or older, have a CBE every year. Also consider having a breast X-ray (mammogram) every year.  If you have a family history of breast cancer, talk to your health care provider about genetic screening.  If you are at high risk for breast cancer, talk to your health care provider about having an MRI and a mammogram every year.  Breast cancer gene (BRCA) assessment is recommended for women who have family members with BRCA-related cancers. BRCA-related cancers  include: ? Breast. ? Ovarian. ? Tubal. ? Peritoneal cancers.  Results of the assessment will determine the need for genetic counseling and BRCA1 and BRCA2 testing.  Cervical Cancer Your health care provider may recommend that you be screened regularly for cancer of the pelvic organs (ovaries, uterus, and vagina). This screening involves a pelvic examination, including checking for microscopic changes to the surface of your cervix (Pap test). You may be encouraged to have this screening done every 3 years, beginning at age 74.  For women ages 34-65, health care providers may recommend pelvic exams and Pap testing every 3 years, or they may recommend the Pap and pelvic exam, combined with testing for human papilloma virus (HPV), every 5 years. Some types of HPV increase your risk of cervical cancer. Testing for HPV may also be done on women of any age with unclear Pap test results.  Other health care providers may not recommend any screening for nonpregnant women who are considered low risk for pelvic cancer and who do not have symptoms. Ask your health care provider if a screening pelvic exam is right for you.  If you have had past treatment for cervical cancer or a condition that could lead to cancer, you need Pap tests and screening for cancer for at least 20 years after your treatment. If Pap tests have been discontinued, your risk factors (such as having a new sexual partner) need to be reassessed to determine if screening should resume. Some women have medical problems that increase the chance of getting cervical cancer. In these cases, your health care provider may recommend more frequent screening and Pap tests.  Colorectal Cancer  This type of cancer can be detected and often prevented.  Routine colorectal cancer screening usually begins at 23 years of age and continues through 24 years of age.  Your health care provider may recommend screening at an earlier age if you have risk factors  for colon cancer.  Your health care provider may also recommend using home test kits to check for hidden blood in the stool.  A small camera at the end of a tube can be used to examine your colon directly (sigmoidoscopy or colonoscopy). This is done to check for the earliest forms of colorectal cancer.  Routine screening usually begins at age 70.  Direct examination of the colon should be repeated every 5-10 years through 24 years of age. However, you may need to be screened more often if early forms of precancerous polyps or small growths are found.  Skin Cancer  Check your skin from head to toe regularly.  Tell your health care provider about any new moles or changes in moles, especially if there is a change in a mole's shape or color.  Also tell your health care provider if you have a mole that is larger than the size of a pencil eraser.  Always use sunscreen. Apply sunscreen liberally and repeatedly throughout the day.  Protect yourself by wearing long sleeves, pants,  a wide-brimmed hat, and sunglasses whenever you are outside.  Heart disease, diabetes, and high blood pressure  High blood pressure causes heart disease and increases the risk of stroke. High blood pressure is more likely to develop in: ? People who have blood pressure in the high end of the normal range (130-139/85-89 mm Hg). ? People who are overweight or obese. ? People who are African American.  If you are 82-70 years of age, have your blood pressure checked every 3-5 years. If you are 56 years of age or older, have your blood pressure checked every year. You should have your blood pressure measured twice-once when you are at a hospital or clinic, and once when you are not at a hospital or clinic. Record the average of the two measurements. To check your blood pressure when you are not at a hospital or clinic, you can use: ? An automated blood pressure machine at a pharmacy. ? A home blood pressure monitor.  If  you are between 62 years and 4 years old, ask your health care provider if you should take aspirin to prevent strokes.  Have regular diabetes screenings. This involves taking a blood sample to check your fasting blood sugar level. ? If you are at a normal weight and have a low risk for diabetes, have this test once every three years after 24 years of age. ? If you are overweight and have a high risk for diabetes, consider being tested at a younger age or more often. Preventing infection Hepatitis B  If you have a higher risk for hepatitis B, you should be screened for this virus. You are considered at high risk for hepatitis B if: ? You were born in a country where hepatitis B is common. Ask your health care provider which countries are considered high risk. ? Your parents were born in a high-risk country, and you have not been immunized against hepatitis B (hepatitis B vaccine). ? You have HIV or AIDS. ? You use needles to inject street drugs. ? You live with someone who has hepatitis B. ? You have had sex with someone who has hepatitis B. ? You get hemodialysis treatment. ? You take certain medicines for conditions, including cancer, organ transplantation, and autoimmune conditions.  Hepatitis C  Blood testing is recommended for: ? Everyone born from [birth year] through 1967. ? Anyone with known risk factors for hepatitis C.  Sexually transmitted infections (STIs)  You should be screened for sexually transmitted infections (STIs) including gonorrhea and chlamydia if: ? You are sexually active and are younger than 24 years of age. ? You are older than 24 years of age and your health care provider tells you that you are at risk for this type of infection. ? Your sexual activity has changed since you were last screened and you are at an increased risk for chlamydia or gonorrhea. Ask your health care provider if you are at risk.  If you do not have HIV, but are at risk, it may be recommended  that you take a prescription medicine daily to prevent HIV infection. This is called pre-exposure prophylaxis (PrEP). You are considered at risk if: ? You are sexually active and do not regularly use condoms or know the HIV status of your partner(s). ? You take drugs by injection. ? You are sexually active with a partner who has HIV.  Talk with your health care provider about whether you are at high risk of being infected with HIV. If you choose  to begin PrEP, you should first be tested for HIV. You should then be tested every 3 months for as long as you are taking PrEP. Pregnancy  If you are premenopausal and you may become pregnant, ask your health care provider about preconception counseling.  If you may become pregnant, take 400 to 800 micrograms (mcg) of folic acid every day.  If you want to prevent pregnancy, talk to your health care provider about birth control (contraception). Osteoporosis and menopause  Osteoporosis is a disease in which the bones lose minerals and strength with aging. This can result in serious bone fractures. Your risk for osteoporosis can be identified using a bone density scan.  If you are 26 years of age or older, or if you are at risk for osteoporosis and fractures, ask your health care provider if you should be screened.  Ask your health care provider whether you should take a calcium or vitamin D supplement to lower your risk for osteoporosis.  Menopause may have certain physical symptoms and risks.  Hormone replacement therapy may reduce some of these symptoms and risks. Talk to your health care provider about whether hormone replacement therapy is right for you. Follow these instructions at home:  Schedule regular health, dental, and eye exams.  Stay current with your immunizations.  Do not use any tobacco products including cigarettes, chewing tobacco, or electronic cigarettes.  If you are pregnant, do not drink alcohol.  If you are  breastfeeding, limit how much and how often you drink alcohol.  Limit alcohol intake to no more than 1 drink per day for nonpregnant women. One drink equals 12 ounces of beer, 5 ounces of wine, or 1 ounces of hard liquor.  Do not use street drugs.  Do not share needles.  Ask your health care provider for help if you need support or information about quitting drugs.  Tell your health care provider if you often feel depressed.  Tell your health care provider if you have ever been abused or do not feel safe at home. This information is not intended to replace advice given to you by your health care provider. Make sure you discuss any questions you have with your health care provider. Document Released: 07/10/2010 Document Revised: 06/02/2015 Document Reviewed: 09/28/2014 Elsevier Interactive Patient Education  [employer].

## 2017-06-20 NOTE — Progress Notes (Addendum)
Subjective:    Christine Griffin is a 21 y.o. female and is here for a comprehensive physical exam.  HPI  There are no preventive care reminders to display for this patient.  Acute Concerns: R lateral rib soreness -- over the past 2 weeks patient has noticed some soreness to her right lateral rib.  She does [employer] on a regular basis.  Certain activities that she does worsen this pain.  She reports that the pain is constant, and dull.  She also states that she has not taken anything for this yet.  She does have a mole in this area that she is getting checked out next week.  She denies chest pain, shortness of breath, swollen lymph nodes.  She denies any inciting event that caused this pain to start. Dizziness -- dizziness started this morning.  She noticed it when she got out of bed, and while she was working out.  She has not had anything to eat yet today.  States that she normally does not eat until around this time.  She does feel well hydrated.  She denies chest pain, shortness of breath, vision changes, weakness on one side of her body.  Dizziness has essentially resolved at this point. STD screening --patient reports that she recently had sexual intercourse with a new partner.  She would like STD screening.  She does have a history of gonorrhea and chlamydia.  She denies any symptoms or symptoms in her partner.  Chronic Issues: Oral contraceptive use --less trauma currently using Daysee birth control.  Denies any issues with this birth control.  Would like to continue this birth control. Thyroid enlargement --mother has a history of thyroid cancer.  She had an ultrasound of her thyroid performed last July that found mild gland heterogeneity with tiny punctate cystic nodules. Thyroid panel was normal last year.  Health Maintenance: Immunizations -- UTD Colonoscopy -- n/a Mammogram -- n/a PAP -- last year, normal Bone Density -- n/a Diet -- well balanced Caffeine intake  -- no excessive intake Exercise -- very active, does [employer] Current Weight -- Weight: 135 lb 9.6 oz (61.5 kg)  Weight History: Wt Readings from Last 10 Encounters:  06/20/17 135 lb 9.6 oz (61.5 kg)  10/24/16 133 lb (60.3 kg)  08/22/16 133 lb 3.2 oz (60.4 kg)  08/20/16 135 lb 3.2 oz (61.3 kg)  07/06/16 132 lb 12.8 oz (60.2 kg)  Mood -- good No LMP recorded. Birth control -- Daysee birth control  Depression screen [facility] 2/9 07/06/2016  Decreased Interest 0  PHQ - 2 Score 0   Other providers/specialists: None  PMHx, SurgHx, SocialHx, Medications, and Allergies were reviewed in the Visit Navigator and updated as appropriate.   No past medical history on file.   Past Surgical History:  Procedure Laterality Date  . KNEE SURGERY Right 2013     Family History  Problem Relation Age of Onset  . Cancer Mother        Thyroid   . Cancer Father        Skin    Social History   Tobacco Use  . Smoking status: Never Smoker  . Smokeless tobacco: Never Used  Substance Use Topics  . Alcohol use: Yes    Comment: social   . Drug use: No    Review of Systems:   Review of Systems  Constitutional: Negative for chills, fever, malaise/fatigue and weight loss.  HENT: Negative for hearing loss, sinus pain and sore  throat.   Respiratory: Negative for cough and hemoptysis.   Cardiovascular: Negative for chest pain, palpitations, leg swelling and PND.  Gastrointestinal: Negative for abdominal pain, constipation, diarrhea, heartburn, nausea and vomiting.  Genitourinary: Negative for dysuria, frequency and urgency.  Musculoskeletal: Positive for myalgias. Negative for back pain and neck pain.  Skin: Negative for itching and rash.  Neurological: Positive for dizziness. Negative for tingling, seizures and headaches.  Endo/Heme/Allergies: Negative for polydipsia.  Psychiatric/Behavioral: Negative for depression. The patient is not nervous/anxious.     Objective:   BP 110/70  (BP Location: Left Arm, Patient Position: Sitting, Cuff Size: Normal)   Pulse 74   Temp 98.3 F (36.8 C) (Oral)   Ht 5' 5.75" (1.67 m)   Wt 135 lb 9.6 oz (61.5 kg)   SpO2 99%   BMI 22.05 kg/m   General Appearance:    Alert, cooperative, no distress, appears stated age  Head:    Normocephalic, without obvious abnormality, atraumatic  Eyes:    PERRL, conjunctiva/corneas clear, EOM's intact, fundi    benign, both eyes  Ears:    Normal TM's and external ear canals, both ears  Nose:   Nares normal, septum midline, mucosa normal, no drainage    or sinus tenderness  Throat:   Lips, mucosa, and tongue normal; teeth and gums normal  Neck:   Supple, symmetrical, trachea midline, no adenopathy;    thyroid:  no enlargement/tenderness/nodules; no carotid   bruit or JVD  Back:     Symmetric, no curvature, ROM normal, no CVA tenderness  Lungs:     Clear to auscultation bilaterally, respirations unlabored  Chest Wall:    Tenderness to R lateral mid rib, no obvious deformity, swelling or ecchymosis/erythema noted   Heart:    Regular rate and rhythm, S1 and S2 normal, no murmur, rub   or gallop  Breast Exam:    Deferred  Abdomen:     Soft, non-tender, bowel sounds active all four quadrants,    no masses, no organomegaly  Genitalia:    Deferred  Rectal:    Deferred  Extremities:   Extremities normal, atraumatic, no cyanosis or edema  Pulses:   2+ and symmetric all extremities  Skin:   Skin color, texture, turgor normal, no rashes or lesions  Lymph nodes:   Cervical, supraclavicular, and axillary nodes normal  Neurologic:   CNII-XII intact, normal strength, sensation and reflexes    throughout   Results for orders placed or performed in visit on 06/20/17  POCT urine pregnancy  Result Value Ref Range   Preg Test, Ur Negative Negative   Negative Dix-Hallpike.  Assessment/Plan:   Christine Griffin was seen today for annual exam.  Diagnoses and all orders for this visit:  Encounter for general adult  medical examination with abnormal findings Today patient counseled on age appropriate routine health concerns for screening and prevention, each reviewed and up to date or declined. Immunizations reviewed and up to date or declined. Labs ordered and reviewed. Risk factors for depression reviewed and negative. Hearing function and visual acuity are intact. ADLs screened and addressed as needed. Functional ability and level of safety reviewed and appropriate. Education, counseling and referrals performed based on assessed risks today. Patient provided with a copy of personalized plan for preventive services.  Screening examination for STD (sexually transmitted disease) I recommended that she abstain from sex until we get her STD panel back. -     HIV antibody -     RPR -  Urine cytology ancillary only; Future -     Urine cytology ancillary only  Enlarged thyroid Will repeat TSH and free T4 today. -     CBC -     Comprehensive metabolic panel -     TSH -     T4, free  Encounter for lipid screening for cardiovascular disease -     Lipid panel  Rib pain Suspect MSK-related. Provided 30-day prescription of mobic. Follow-up in two weeks with Dr. Berline Choughigby if persists, sooner if worsens. Avoid aggravating activities. -     CBC -     Comprehensive metabolic panel  Dizziness Neuro exam benign. Push fluids, follow-up if symptoms worsen or persist. -     CBC -     Comprehensive metabolic panel -     POCT urine pregnancy  Oral contraceptive surveillance She is not due for refill today.  Pregnancy test negative.  Allow refill if within the year.  Other orders -     meloxicam (MOBIC) 15 MG tablet; Take 1 tablet (15 mg total) by mouth daily.    Well Adult Exam: Labs ordered: Yes. Patient counseling was done. See below for items discussed. Discussed the patient's BMI. The BMI BMI is in the acceptable range Follow up as needed for acute illness.  Patient Counseling:   [x]     Nutrition:  Stressed importance of moderation in sodium/caffeine intake, saturated fat and cholesterol, caloric balance, sufficient intake of fresh fruits, vegetables, fiber, calcium, iron, and 1 mg of folate supplement per day (for females capable of pregnancy).   [x]      Stressed the importance of regular exercise.    [x]     Substance Abuse: Discussed cessation/primary prevention of tobacco, alcohol, or other drug use; driving or other dangerous activities under the influence; availability of treatment for abuse.    [x]      Injury prevention: Discussed safety belts, safety helmets, smoke detector, smoking near bedding or upholstery.    [x]      Sexuality: Discussed sexually transmitted diseases, partner selection, use of condoms, avoidance of unintended pregnancy  and contraceptive alternatives.    [x]     Dental health: Discussed importance of regular tooth brushing, flossing, and dental visits.   [x]      Health maintenance and immunizations reviewed. Please refer to Health maintenance section.    Jarold MottoSamantha Berenis Corter, PA-C [facility]

## 2017-06-21 LAB — HIV ANTIBODY (ROUTINE TESTING W REFLEX): HIV: NONREACTIVE

## 2017-06-21 LAB — URINE CYTOLOGY ANCILLARY ONLY
Chlamydia: NEGATIVE
Neisseria Gonorrhea: NEGATIVE
TRICH (WINDOWPATH): NEGATIVE

## 2017-06-21 LAB — RPR: RPR: NONREACTIVE

## 2017-08-20 DIAGNOSIS — D489 Neoplasm of uncertain behavior, unspecified: Secondary | ICD-10-CM | POA: Diagnosis not present

## 2017-08-20 DIAGNOSIS — L910 Hypertrophic scar: Secondary | ICD-10-CM | POA: Diagnosis not present

## 2017-08-20 DIAGNOSIS — D229 Melanocytic nevi, unspecified: Secondary | ICD-10-CM | POA: Diagnosis not present

## 2017-08-31 DIAGNOSIS — W5501XA Bitten by cat, initial encounter: Secondary | ICD-10-CM | POA: Diagnosis not present

## 2017-08-31 DIAGNOSIS — Z23 Encounter for immunization: Secondary | ICD-10-CM | POA: Diagnosis not present

## 2017-08-31 DIAGNOSIS — S61432A Puncture wound without foreign body of left hand, initial encounter: Secondary | ICD-10-CM | POA: Diagnosis not present

## 2017-09-23 DIAGNOSIS — K080 Exfoliation of teeth due to systemic causes: Secondary | ICD-10-CM | POA: Diagnosis not present

## 2017-10-24 ENCOUNTER — Encounter: Payer: Self-pay | Admitting: Physician Assistant

## 2017-10-29 IMAGING — US US THYROID
1 series · 14 of 25 positions shown · non-contrast
Comparison: None.

CLINICAL DATA: Thyromegaly on exam

EXAM:
THYROID ULTRASOUND
TECHNIQUE: Ultrasound examination of the thyroid gland and adjacent soft
tissues was performed.

[Series 1: us thyroid · 0.04mm/px · 14 of 32 slices shown]
[im 1/32]
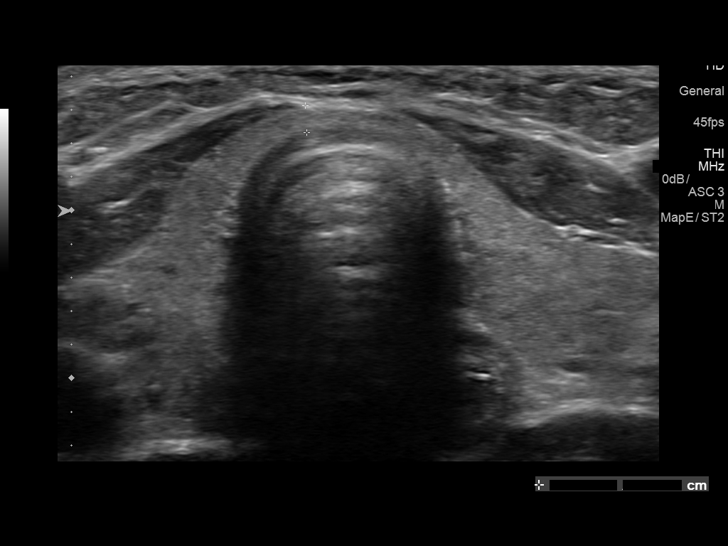
[im 3/32]
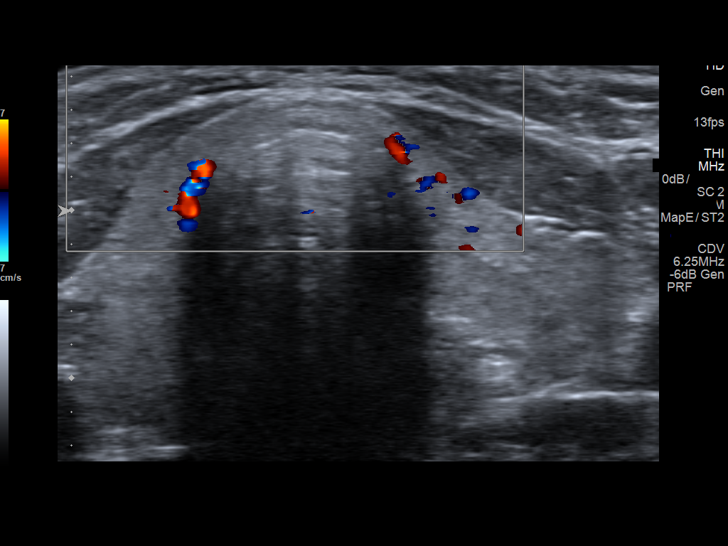
[im 6/32]
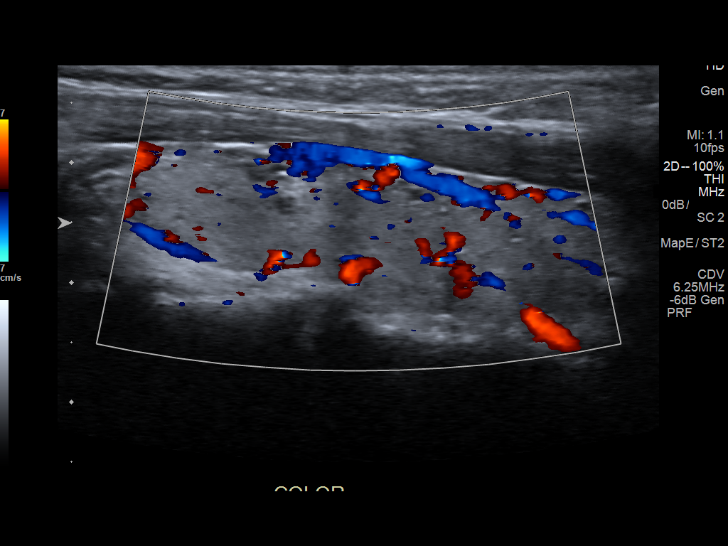
[im 8/32]
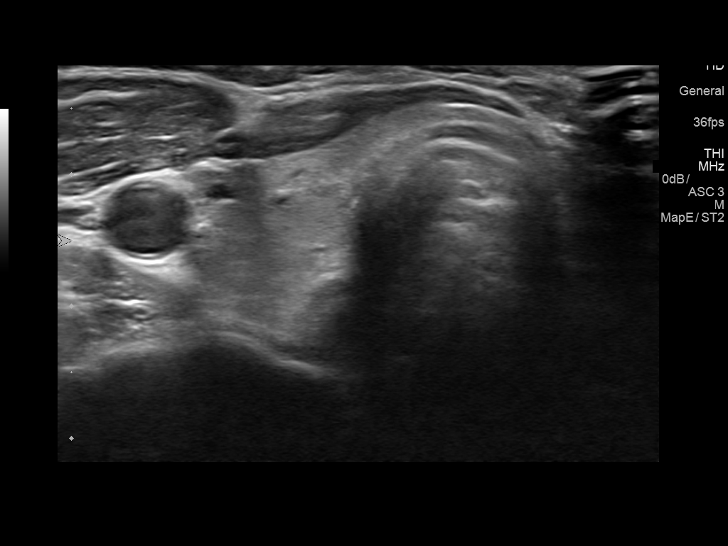
[im 11/32]
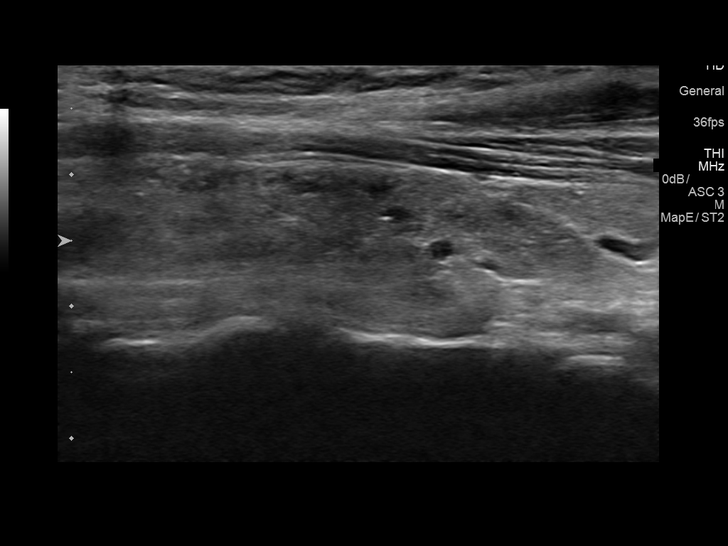
[im 12/32]
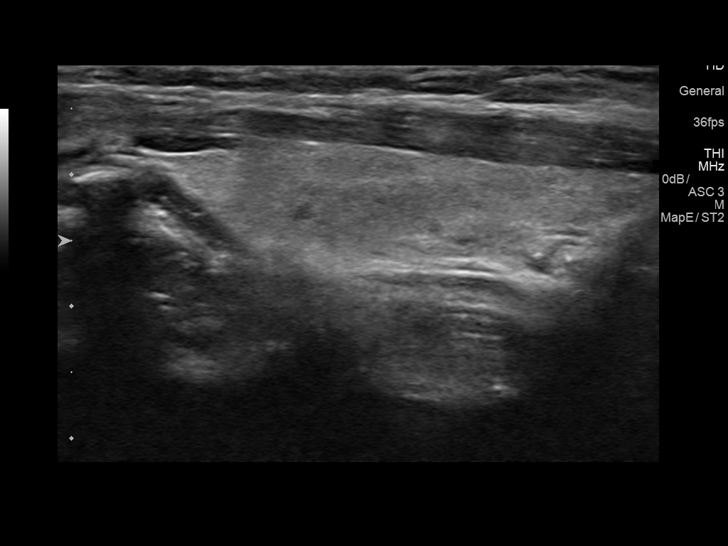
[im 15/32]
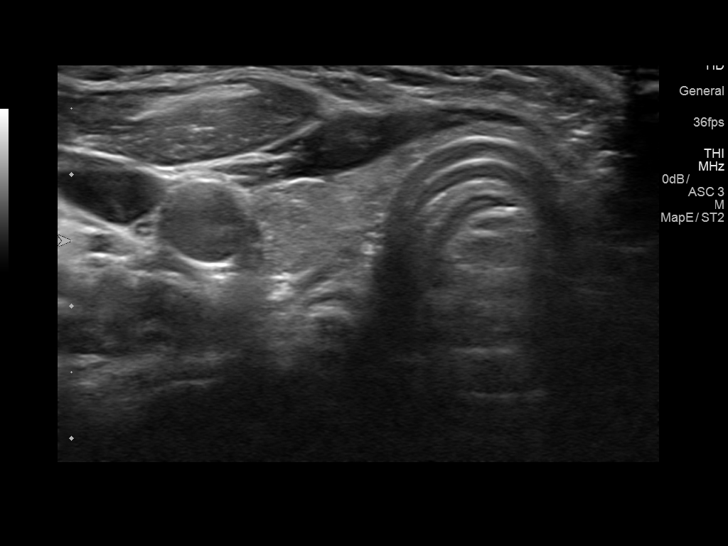
[im 17/32]
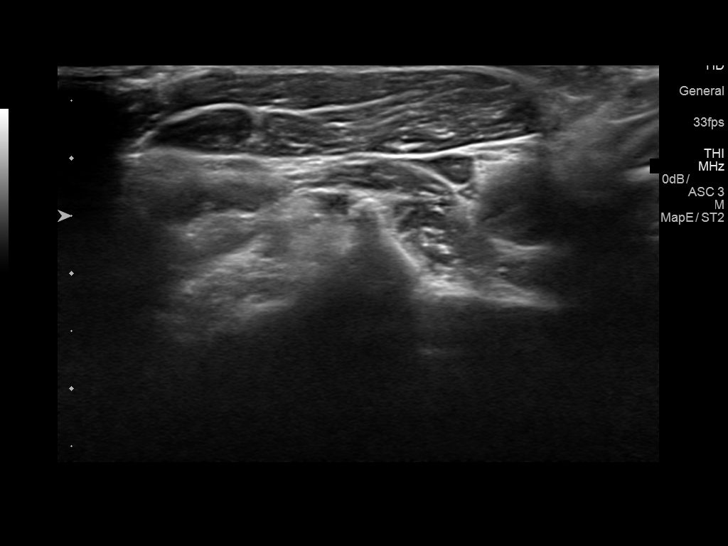
[im 20/32]
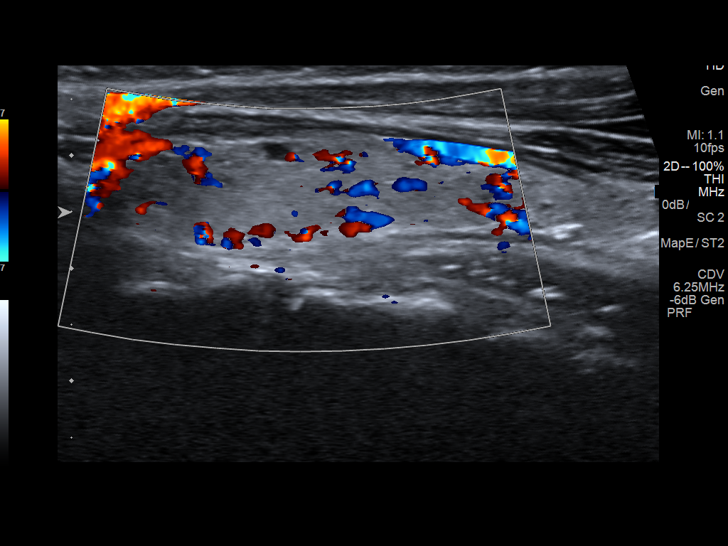
[im 21/32]
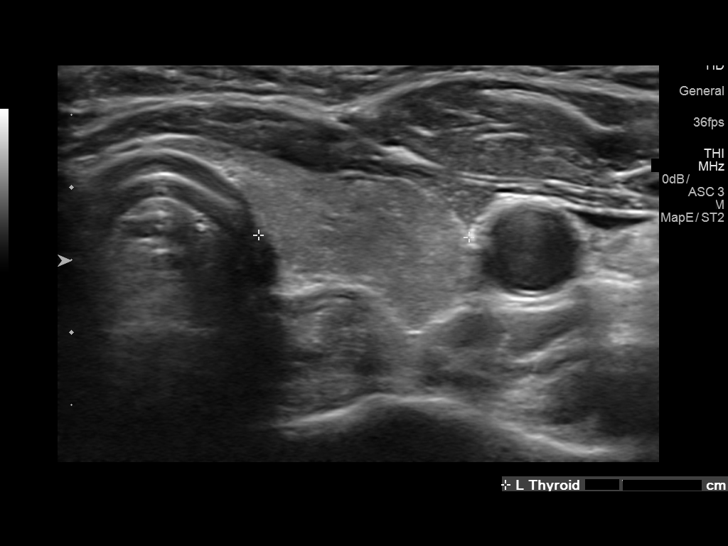
[im 24/32]
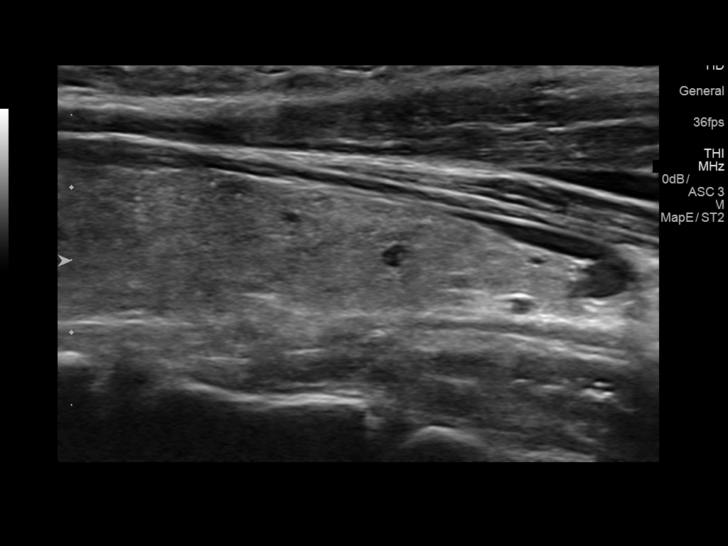
[im 26/32]
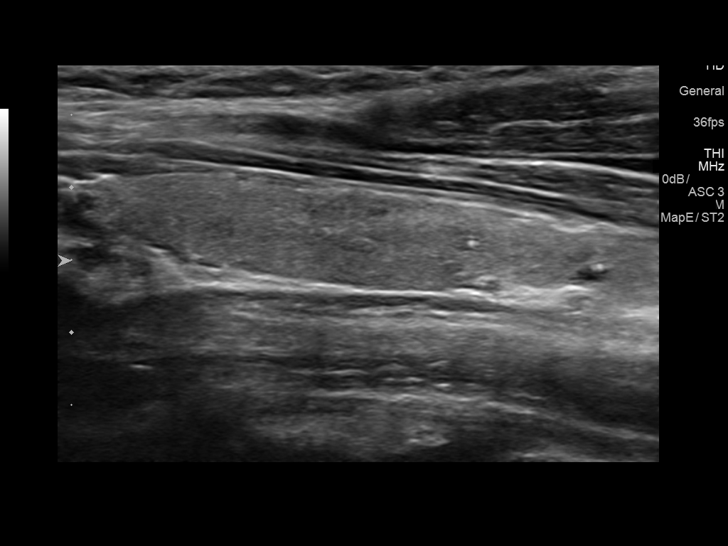
[im 29/32]
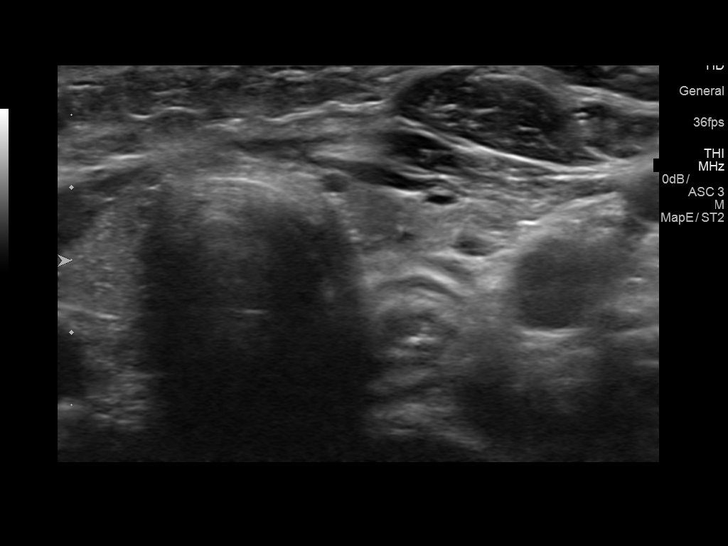
[im 32/32]
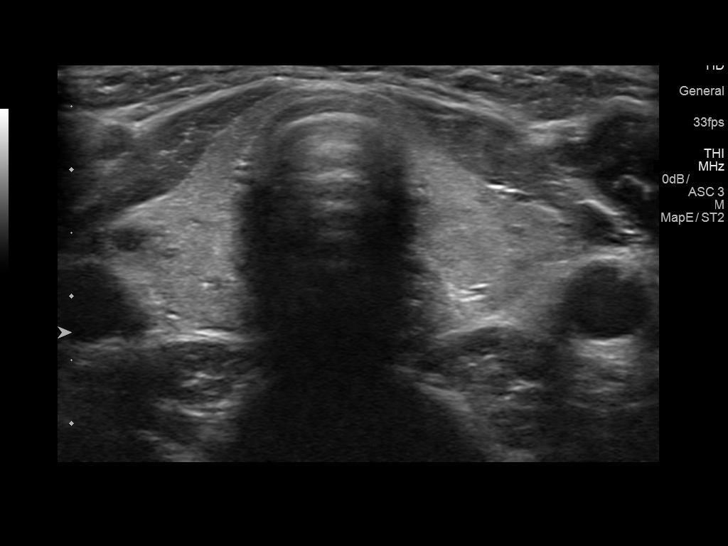

[14 of 25 positions shown; findings below may reference images not displayed]

FINDINGS: Parenchymal Echotexture: Mildly heterogenous

Isthmus: 2 mm

Right lobe: 5.0 x 1.2 x 1.3 cm

Left lobe: 4.8 x 0.9 x 1.5 cm

_________________________________________________________

Estimated total number of nodules >/= 1 cm: 0

Number of spongiform nodules >/=  2 cm not described below (TR1): 0

Number of mixed cystic and solid nodules >/= 1.5 cm not described
below (TR2): 0

_________________________________________________________

Mildly heterogeneous thyroid gland with tiny punctate subcentimeter
hypoechoic predominantly cystic nodules bilaterally. These do not
meet criteria for biopsy or any dedicated follow-up imaging. No
significant thyroid abnormality. No adenopathy.
IMPRESSION: Mild gland heterogeneity with tiny punctate cystic nodules. See
above comment.

The above is in keeping with the ACR TI-RADS recommendations - [HOSPITAL] 2687;[DATE].

## 2017-11-21 ENCOUNTER — Other Ambulatory Visit: Payer: Self-pay | Admitting: Physician Assistant

## 2017-11-22 ENCOUNTER — Encounter: Payer: Self-pay | Admitting: Physician Assistant

## 2017-11-22 ENCOUNTER — Ambulatory Visit: Payer: Federal, State, Local not specified - PPO | Admitting: Physician Assistant

## 2017-11-22 ENCOUNTER — Encounter: Payer: Self-pay | Admitting: Obstetrics and Gynecology

## 2017-11-22 VITALS — BP 114/66 | HR 79 | Temp 99.2°F | Ht 65.75 in | Wt 137.2 lb

## 2017-11-22 DIAGNOSIS — Z3009 Encounter for other general counseling and advice on contraception: Secondary | ICD-10-CM | POA: Diagnosis not present

## 2017-11-22 NOTE — Progress Notes (Signed)
Christine Griffin is a 22 y.o. female is here to discuss: Contraception options  I acted as a Neurosurgeonscribe for Energy East CorporationSamantha Zohra Clavel, PA-C Corky Mullonna Orphanos, LPN  History of Present Illness:   Chief Complaint  Patient presents with  . Contraception    HPI   Pt is presently on oral birth control pills, takes them 3 months at a time and then has menstrual cycle. Pt c/o brown spotting one month prior to cycle being due. When she started having her spotting she decided to go ahead and start her inactive pills and then had her period. Cycles are light to moderate in flow last 5 days. Slight cramps first day.   She states that she was on monthly birth control pills for a bit but then she switched to 2641-month to help with her acne and heavy flow.  There are no preventive care reminders to display for this patient.  History reviewed. No pertinent past medical history.   Social History   Socioeconomic History  . Marital status: Single    Spouse name: Not on file  . Number of children: Not on file  . Years of education: Not on file  . Highest education level: Not on file  Occupational History  . Not on file  Social Needs  . Financial resource strain: Not on file  . Food insecurity:    Worry: Not on file    Inability: Not on file  . Transportation needs:    Medical: Not on file    Non-medical: Not on file  Tobacco Use  . Smoking status: Never Smoker  . Smokeless tobacco: Never Used  Substance and Sexual Activity  . Alcohol use: Yes    Comment: social   . Drug use: No  . Sexual activity: Yes    Birth control/protection: Pill  Lifestyle  . Physical activity:    Days per week: Not on file    Minutes per session: Not on file  . Stress: Not on file  Relationships  . Social connections:    Talks on phone: Not on file    Gets together: Not on file    Attends religious service: Not on file    Active member of club or organization: Not on file    Attends meetings of clubs or organizations:  Not on file    Relationship status: Not on file  . Intimate partner violence:    Fear of current or ex partner: Not on file    Emotionally abused: Not on file    Physically abused: Not on file    Forced sexual activity: Not on file  Other Topics Concern  . Not on file  Social History Narrative   Single   Graduated from [employer] 2018   Works for [employer]   Plays soccer and exercises for recreation    Past Surgical History:  Procedure Laterality Date  . KNEE SURGERY Right 2013    Family History  Problem Relation Age of Onset  . Cancer Mother        Thyroid   . Cancer Father        Skin    PMHx, SurgHx, SocialHx, FamHx, Medications, and Allergies were reviewed in the Visit Navigator and updated as appropriate.   Patient Active Problem List   Diagnosis Date Noted  . Chlamydial infection 06/18/2016    Social History   Tobacco Use  . Smoking status: Never Smoker  . Smokeless tobacco: Never Used  Substance Use Topics  .  Alcohol use: Yes    Comment: social   . Drug use: No    Current Medications and Allergies:    Current Outpatient Medications:  .  DAYSEE 0.15-0.03 &0.01 MG tablet, TAKE 1 TABLET BY MOUTH DAILY, Disp: 91 tablet, Rfl: 1 .  azithromycin (ZITHROMAX) 250 MG tablet, Take 2 tabs today, then 1 daily for 4 days, Disp: , Rfl:   No Known Allergies  Review of Systems   ROS  Negative unless otherwise specified per HPI.  Vitals:   Vitals:   11/22/17 0838  BP: 114/66  Pulse: 79  Temp: 99.2 F (37.3 C)  TempSrc: Oral  SpO2: 99%  Weight: 137 lb 4 oz (62.3 kg)  Height: 5' 5.75" (1.67 m)     Body mass index is 22.32 kg/m.   Physical Exam:    Physical Exam  Constitutional: She appears well-developed. She is cooperative.  Non-toxic appearance. She does not have a sickly appearance. She does not appear ill. No distress.  Cardiovascular: Normal rate, regular rhythm, S1 normal, S2 normal, normal heart sounds and normal  pulses.  No LE edema  Pulmonary/Chest: Effort normal and breath sounds normal.  Neurological: She is alert. GCS eye subscore is 4. GCS verbal subscore is 5. GCS motor subscore is 6.  Skin: Skin is warm, dry and intact.  Psychiatric: She has a normal mood and affect. Her speech is normal and behavior is normal.  Nursing note and vitals reviewed.    Assessment and Plan:    Christine Griffin was seen today for contraception.  Diagnoses and all orders for this visit:  Counseling for birth control regarding intrauterine device (IUD) -     Ambulatory referral to Obstetrics / Gynecology   She is interested in IUD placement. Counseling provided. Continue current birth control regimen until seen by Ob-Gyn.   . Reviewed expectations re: course of current medical issues. . Discussed self-management of symptoms. . Outlined signs and symptoms indicating need for more acute intervention. . Patient verbalized understanding and all questions were answered. . See orders for this visit as documented in the electronic medical record. . Patient received an After Visit Summary.  CMA or LPN served as scribe during this visit. History, Physical, and Plan performed by medical provider. The above documentation has been reviewed and is accurate and complete.  Jarold Motto, PA-C [place] [place] 11/22/2017  Follow-up: No follow-ups on file.

## 2017-11-22 NOTE — Patient Instructions (Signed)
It was great to see you!  You will be contacted about your referral to ob-gyn for IUD discussion!  Take care,  Christine MottoSamantha Shamirah Ivan PA-C   Intrauterine Device Information An intrauterine device (IUD) is inserted into your uterus to prevent pregnancy. There are two types of IUDs available:  Copper IUD-This type of IUD is wrapped in copper wire and is placed inside the uterus. Copper makes the uterus and fallopian tubes produce a fluid that kills sperm. The copper IUD can stay in place for 10 years.  Hormone IUD-This type of IUD contains the hormone progestin (synthetic progesterone). The hormone thickens the cervical mucus and prevents sperm from entering the uterus. It also thins the uterine lining to prevent implantation of a fertilized egg. The hormone can weaken or kill the sperm that get into the uterus. One type of hormone IUD can stay in place for 5 years, and another type can stay in place for 3 years.  Your health care provider will make sure you are a good candidate for a contraceptive IUD. Discuss with your health care provider the possible side effects. Advantages of an intrauterine device  IUDs are highly effective, reversible, long acting, and low maintenance.  There are no estrogen-related side effects.  An IUD can be used when breastfeeding.  IUDs are not associated with weight gain.  The copper IUD works immediately after insertion.  The hormone IUD works right away if inserted within 7 days of your period starting. You will need to use a backup method of birth control for 7 days if the hormone IUD is inserted at any other time in your cycle.  The copper IUD does not interfere with your female hormones.  The hormone IUD can make heavy menstrual periods lighter and decrease cramping.  The hormone IUD can be used for 3 or 5 years.  The copper IUD can be used for 10 years. Disadvantages of an intrauterine device  The hormone IUD can be associated with irregular  bleeding patterns.  The copper IUD can make your menstrual flow heavier and more painful.  You may experience cramping and vaginal bleeding after insertion. This information is not intended to replace advice given to you by your health care provider. Make sure you discuss any questions you have with your health care provider. Document Released: 11/29/2003 Document Revised: 06/02/2015 Document Reviewed: 06/15/2012 Elsevier Interactive Patient Education  2017 [employer]

## 2017-12-18 ENCOUNTER — Encounter: Payer: Self-pay | Admitting: Obstetrics and Gynecology

## 2017-12-18 ENCOUNTER — Other Ambulatory Visit: Payer: Self-pay

## 2017-12-18 ENCOUNTER — Encounter

## 2017-12-18 ENCOUNTER — Ambulatory Visit: Payer: Federal, State, Local not specified - PPO | Admitting: Obstetrics and Gynecology

## 2017-12-18 VITALS — BP 124/76 | HR 82 | Ht 65.75 in | Wt 138.6 lb

## 2017-12-18 DIAGNOSIS — Z01419 Encounter for gynecological examination (general) (routine) without abnormal findings: Secondary | ICD-10-CM | POA: Diagnosis not present

## 2017-12-18 DIAGNOSIS — N898 Other specified noninflammatory disorders of vagina: Secondary | ICD-10-CM | POA: Diagnosis not present

## 2017-12-18 DIAGNOSIS — Z113 Encounter for screening for infections with a predominantly sexual mode of transmission: Secondary | ICD-10-CM | POA: Diagnosis not present

## 2017-12-18 DIAGNOSIS — Z3009 Encounter for other general counseling and advice on contraception: Secondary | ICD-10-CM | POA: Diagnosis not present

## 2017-12-18 NOTE — Patient Instructions (Addendum)

## 2017-12-18 NOTE — Progress Notes (Signed)
22 y.o. G0P0000 Single White or Caucasian Not Hispanic or Latino female here for annual exam and to discuss IUD options. Prior to OCP's cycles were regular, heavy with cramps.  On the 3 month pill her cycles are very light, but coming prior to the last week.  Sexually active, same partner since 2/19. She had negative STD testing in 6/19. She would like retesting.  She has vaginal d/c, long term. Slight burning. No dyspareunia.    Period Cycle (Days): 84 Period Duration (Days): 5 days Period Pattern: Regular Menstrual Flow: Light Menstrual Control: Tampon, Thin pad Menstrual Control Change Freq (Hours): changes tampon/pad every 4 hours Dysmenorrhea: (!) Mild Dysmenorrhea Symptoms: Cramping  Patient's last menstrual period was 10/20/2017.          Sexually active: Yes.    The current method of family planning is OCP (estrogen/progesterone).    Exercising: Yes.    running Smoker:  no  Health Maintenance: Pap:  06/18/2016 WNL History of abnormal Pap:  No TDaP:  09/2017 Gardasil: Unsure, she will check with her Mom.    reports that she has never smoked. She has never used smokeless tobacco. She reports that she drinks about 2.0 standard drinks of alcohol per week. She reports that she does not use drugs. Works for a [employer].   Past Medical History:  Diagnosis Date  . Dysmenorrhea   . STD (sexually transmitted disease)     Past Surgical History:  Procedure Laterality Date  . KNEE SURGERY Right 2013    Current Outpatient Medications  Medication Sig Dispense Refill  . DAYSEE 0.15-0.03 &0.01 MG tablet TAKE 1 TABLET BY MOUTH DAILY 91 tablet 1   No current facility-administered medications for this visit.     Family History  Problem Relation Age of Onset  . Cancer Mother        Thyroid   . Cancer Father        Skin    Review of Systems  Constitutional: Negative.   HENT: Negative.   Eyes: Negative.   Respiratory: Negative.   Cardiovascular: Negative.    Gastrointestinal: Negative.   Endocrine: Negative.   Genitourinary: Positive for vaginal discharge.  Musculoskeletal:       Breast pain bilateral  Skin: Negative.   Allergic/Immunologic: Negative.   Neurological: Negative.   Hematological: Negative.   Psychiatric/Behavioral: Negative.   The breast pain was a month ago, mostly gone. Still full and heavy, no leakage.   Exam:   BP 124/76 (BP Location: Right Arm, Patient Position: Sitting, Cuff Size: Normal)   Pulse 82   Ht 5' 5.75" (1.67 m)   Wt 138 lb 9.6 oz (62.9 kg)   LMP 10/20/2017   BMI 22.54 kg/m   Weight change: @WEIGHTCHANGE @ Height:   Height: 5' 5.75" (167 cm)  Ht Readings from Last 3 Encounters:  12/18/17 5' 5.75" (1.67 m)  11/22/17 5' 5.75" (1.67 m)  06/20/17 5' 5.75" (1.67 m)    General appearance: alert, cooperative and appears stated age Head: Normocephalic, without obvious abnormality, atraumatic Neck: no adenopathy, supple, symmetrical, trachea midline and thyroid normal to inspection and palpation Lungs: clear to auscultation bilaterally Cardiovascular: regular rate and rhythm Breasts: normal appearance, no masses or tenderness Abdomen: soft, non-tender; non distended,  no masses,  no organomegaly Extremities: extremities normal, atraumatic, no cyanosis or edema Skin: Skin color, texture, turgor normal. No rashes or lesions Lymph nodes: Cervical, supraclavicular, and axillary nodes normal. No abnormal inguinal nodes palpated Neurologic: Grossly normal  Pelvic: External genitalia:  no lesions              Urethra:  normal appearing urethra with no masses, tenderness or lesions              Bartholins and Skenes: normal                 Vagina: normal appearing vagina with normal color. Slight increase in creamy, white vaginal d/c              Cervix: no lesions               Bimanual Exam:  Uterus:  normal size, contour, position, consistency, mobility, non-tender and retroverted              Adnexa: no  mass, fullness, tenderness               Rectovaginal: Confirms               Anus:  normal sphincter tone, no lesions  Chaperone was present for exam.  A:  Well Woman with normal exam  Contraception counseling, she is interested in the IUD (mirea or Pal[place]ena)  Vaginal d/c  H/O Chlamydia, discussed risks and need for early monitoring when she gets pregnant in the future  P:   No pap needed this year  Return for IUD insertion (likely mirena), information given, risks and side effects reviewed  Screening STD  Affirm sent  She will check if she had the gardasil series  Discussed breast self exam  Discussed calcium and vit D intake

## 2017-12-19 LAB — RPR: RPR Ser Ql: NONREACTIVE

## 2017-12-19 LAB — HIV ANTIBODY (ROUTINE TESTING W REFLEX): HIV SCREEN 4TH GENERATION: NONREACTIVE

## 2017-12-19 LAB — VAGINITIS/VAGINOSIS, DNA PROBE
CANDIDA SPECIES: NEGATIVE
Gardnerella vaginalis: NEGATIVE
Trichomonas vaginosis: NEGATIVE

## 2017-12-20 LAB — GC/CHLAMYDIA PROBE AMP
Chlamydia trachomatis, NAA: NEGATIVE
NEISSERIA GONORRHOEAE BY PCR: NEGATIVE

## 2017-12-24 ENCOUNTER — Encounter: Payer: Self-pay | Admitting: Obstetrics and Gynecology

## 2017-12-24 ENCOUNTER — Ambulatory Visit (INDEPENDENT_AMBULATORY_CARE_PROVIDER_SITE_OTHER): Payer: Federal, State, Local not specified - PPO | Admitting: Obstetrics and Gynecology

## 2017-12-24 VITALS — BP 118/62 | HR 80 | Resp 16 | Ht 65.75 in | Wt 138.0 lb

## 2017-12-24 DIAGNOSIS — Z3009 Encounter for other general counseling and advice on contraception: Secondary | ICD-10-CM

## 2017-12-24 DIAGNOSIS — Z30430 Encounter for insertion of intrauterine contraceptive device: Secondary | ICD-10-CM | POA: Diagnosis not present

## 2017-12-24 LAB — POCT URINE PREGNANCY: Preg Test, Ur: NEGATIVE

## 2017-12-24 NOTE — Patient Instructions (Signed)
IUD Post-procedure Instructions Cramping is common.  You may take Ibuprofen, Aleve, or Tylenol for the cramping.  This should resolve within 24 hours.   You may have a small amount of spotting.  You should wear a mini pad for the next few days. You may have intercourse in 24 hours. You need to call the office if you have any pelvic pain, fever, heavy bleeding, or foul smelling vaginal discharge. Shower or bathe as normal Use back up contraception for one week 

## 2017-12-24 NOTE — Progress Notes (Signed)
GYNECOLOGY  VISIT   HPI: 22 y.o.   Single White or Caucasian Not Hispanic or Latino  female   G0P0000 with Patient's last menstrual period was 10/20/2017.   here for IUD insertion -- patient wants Mirena. Recent negative STD testing  GYNECOLOGIC HISTORY: Patient's last menstrual period was 10/20/2017. Contraception:OCP Menopausal hormone therapy: n/a        OB History    Gravida  0   Para  0   Term  0   Preterm  0   AB  0   Living  0     SAB  0   TAB  0   Ectopic  0   Multiple  0   Live Births  0              Patient Active Problem List   Diagnosis Date Noted  . Chlamydial infection 06/18/2016    Past Medical History:  Diagnosis Date  . Dysmenorrhea   . STD (sexually transmitted disease)     Past Surgical History:  Procedure Laterality Date  . KNEE SURGERY Right 2013    Current Outpatient Medications  Medication Sig Dispense Refill  . DAYSEE 0.15-0.03 &0.01 MG tablet TAKE 1 TABLET BY MOUTH DAILY 91 tablet 1   No current facility-administered medications for this visit.      ALLERGIES: Patient has no known allergies.  Family History  Problem Relation Age of Onset  . Cancer Mother        Thyroid   . Cancer Father        Skin    Social History   Socioeconomic History  . Marital status: Single    Spouse name: Not on file  . Number of children: Not on file  . Years of education: Not on file  . Highest education level: Not on file  Occupational History  . Not on file  Social Needs  . Financial resource strain: Not on file  . Food insecurity:    Worry: Not on file    Inability: Not on file  . Transportation needs:    Medical: Not on file    Non-medical: Not on file  Tobacco Use  . Smoking status: Never Smoker  . Smokeless tobacco: Never Used  Substance and Sexual Activity  . Alcohol use: Yes    Alcohol/week: 2.0 standard drinks    Types: 2 Standard drinks or equivalent per week    Comment: social   . Drug use: No  . Sexual  activity: Yes    Birth control/protection: Pill  Lifestyle  . Physical activity:    Days per week: Not on file    Minutes per session: Not on file  . Stress: Not on file  Relationships  . Social connections:    Talks on phone: Not on file    Gets together: Not on file    Attends religious service: Not on file    Active member of club or organization: Not on file    Attends meetings of clubs or organizations: Not on file    Relationship status: Not on file  . Intimate partner violence:    Fear of current or ex partner: Not on file    Emotionally abused: Not on file    Physically abused: Not on file    Forced sexual activity: Not on file  Other Topics Concern  . Not on file  Social History Narrative   Single   Graduated from [employer] 2018   Works for  [employer] soccer and exercises for recreation    Review of Systems  Constitutional: Negative.   HENT: Negative.   Eyes: Negative.   Respiratory: Negative.   Cardiovascular: Negative.   Gastrointestinal: Negative.   Genitourinary: Negative.   Musculoskeletal: Negative.   Skin: Negative.   Neurological: Negative.   Endo/Heme/Allergies: Negative.   Psychiatric/Behavioral: Negative.     PHYSICAL EXAMINATION:    BP 118/62 (BP Location: Right Arm, Patient Position: Sitting, Cuff Size: Normal)   Pulse 80   Resp 16   Ht 5' 5.75" (1.67 m)   Wt 138 lb (62.6 kg)   LMP 10/20/2017   BMI 22.44 kg/m     General appearance: alert, cooperative and appears stated age  Pelvic: External genitalia:  no lesions              Urethra:  normal appearing urethra with no masses, tenderness or lesions              Bartholins and Skenes: normal                 Vagina: normal appearing vagina with normal color and discharge, no lesions              Cervix: no lesions  The risks of the mirena IUD were reviewed with the patient, including infection, abnormal bleeding and uterine perfortion. Consent was  signed.  A speculum was placed in the vagina, the cervix was cleansed with betadine. A tenaculum was placed on the cervix, the uterus sounded to 8 cm. The cervix was dilated to a 5 hagar dilator  The mirena IUD was inserted without difficulty. The string were cut to 3-4 cm. The tenaculum was removed. Slight oozing from the tenaculum site was stopped with pressure.   The patient tolerated the procedure well.    Chaperone was present for exam.  ASSESSMENT Mirena IUD insertion    PLAN F/U in one month Call with any concerns Condoms use encouraged, must use back up contraception for one week   An After Visit Summary was printed and given to the patient.

## 2018-01-22 ENCOUNTER — Ambulatory Visit: Payer: Federal, State, Local not specified - PPO | Admitting: Obstetrics and Gynecology

## 2018-01-27 NOTE — Progress Notes (Signed)
GYNECOLOGY  VISIT   HPI: 23 y.o.   Single White or Caucasian Not Hispanic or Latino  female   G0P0000 with Patient's last menstrual period was 01/03/2018 (exact date).   here for 1 month IUD recheck. She had a mirena placed in 12/24/17. She had some spotting and cramping initially, currently feeling fine.  She noticed a discoloration in her right axilla, it was itchy, not now. Hasn't noticed it any where else. It may be a little lighter.   GYNECOLOGIC HISTORY: Patient's last menstrual period was 01/03/2018 (exact date). Contraception: IUD Menopausal hormone therapy: None        OB History    Gravida  0   Para  0   Term  0   Preterm  0   AB  0   Living  0     SAB  0   TAB  0   Ectopic  0   Multiple  0   Live Births  0              Patient Active Problem List   Diagnosis Date Noted  . Chlamydial infection 06/18/2016    Past Medical History:  Diagnosis Date  . Dysmenorrhea   . STD (sexually transmitted disease)     Past Surgical History:  Procedure Laterality Date  . KNEE SURGERY Right 2013    Current Outpatient Medications  Medication Sig Dispense Refill  . levonorgestrel (MIRENA) 20 MCG/24HR IUD 1 each by Intrauterine route once.     No current facility-administered medications for this visit.      ALLERGIES: Patient has no known allergies.  Family History  Problem Relation Age of Onset  . Cancer Mother        Thyroid   . Cancer Father        Skin    Social History   Socioeconomic History  . Marital status: Single    Spouse name: Not on file  . Number of children: Not on file  . Years of education: Not on file  . Highest education level: Not on file  Occupational History  . Not on file  Social Needs  . Financial resource strain: Not on file  . Food insecurity:    Worry: Not on file    Inability: Not on file  . Transportation needs:    Medical: Not on file    Non-medical: Not on file  Tobacco Use  . Smoking status: Never  Smoker  . Smokeless tobacco: Never Used  Substance and Sexual Activity  . Alcohol use: Yes    Alcohol/week: 2.0 standard drinks    Types: 2 Standard drinks or equivalent per week    Comment: social   . Drug use: No  . Sexual activity: Yes    Birth control/protection: I.U.D.    Comment: Mirena inserted 12/24/2017  Lifestyle  . Physical activity:    Days per week: Not on file    Minutes per session: Not on file  . Stress: Not on file  Relationships  . Social connections:    Talks on phone: Not on file    Gets together: Not on file    Attends religious service: Not on file    Active member of club or organization: Not on file    Attends meetings of clubs or organizations: Not on file    Relationship status: Not on file  . Intimate partner violence:    Fear of current or ex partner: Not on file  Emotionally abused: Not on file    Physically abused: Not on file    Forced sexual activity: Not on file  Other Topics Concern  . 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   HENT: Negative.   Eyes: Negative.   Respiratory: Negative.   Cardiovascular: Negative.   Gastrointestinal: Negative.   Genitourinary: Negative.   Musculoskeletal: Negative.   Skin:       Discoloration under right axilla  Neurological: Negative.   Endo/Heme/Allergies: Negative.   Psychiatric/Behavioral: Negative.     PHYSICAL EXAMINATION:    BP 118/64 (BP Location: Right Arm, Patient Position: Sitting, Cuff Size: Normal)   Pulse 64   Wt 138 lb (62.6 kg)   LMP 01/03/2018 (Exact Date)   BMI 22.44 kg/m     General appearance: alert, cooperative and appears stated age Skin: in her right axilla, just lateral to the area she shaves is an ~2 cm circular area of increased pigmentation.   Pelvic: External genitalia:  no  lesions              Urethra:  normal appearing urethra with no masses, tenderness or lesions              Bartholins and Skenes: normal                 Vagina: normal appearing vagina with normal color and discharge, no lesions              Cervix: no lesions and IUD string 2 cm              Bimanual Exam:  Uterus:  normal size, contour, position, consistency, mobility, non-tender              Adnexa: no mass, fullness, tenderness               Chaperone was present for exam.  ASSESSMENT IUD check, doing well Rash, suspect fungal infection, itching has resolved H/O Gonorrhea and Chlamydia. After the exam she had questions as to the implications for future childbearing    PLAN Routine f/u for annual exam in 12/20 Try an over the counter fungal cream, if not improving f/u with primary Discussed the risk of infertility and ectopic pregnancy. Discussed need for close f/u when pregnant   An After Visit Summary was printed and given to the patient.

## 2018-01-28 ENCOUNTER — Other Ambulatory Visit: Payer: Self-pay

## 2018-01-28 ENCOUNTER — Encounter: Payer: Self-pay | Admitting: Obstetrics and Gynecology

## 2018-01-28 ENCOUNTER — Ambulatory Visit: Payer: Federal, State, Local not specified - PPO | Admitting: Obstetrics and Gynecology

## 2018-01-28 VITALS — BP 118/64 | HR 64 | Wt 138.0 lb

## 2018-01-28 DIAGNOSIS — R21 Rash and other nonspecific skin eruption: Secondary | ICD-10-CM

## 2018-01-28 DIAGNOSIS — Z30431 Encounter for routine checking of intrauterine contraceptive device: Secondary | ICD-10-CM | POA: Diagnosis not present

## 2018-01-28 DIAGNOSIS — Z8619 Personal history of other infectious and parasitic diseases: Secondary | ICD-10-CM

## 2018-01-28 HISTORY — DX: Personal history of other infectious and parasitic diseases: Z86.19

## 2018-02-08 DIAGNOSIS — R21 Rash and other nonspecific skin eruption: Secondary | ICD-10-CM | POA: Diagnosis not present

## 2018-05-22 ENCOUNTER — Other Ambulatory Visit: Payer: Self-pay | Admitting: Physician Assistant

## 2021-10-02 ENCOUNTER — Encounter: Payer: Self-pay | Admitting: *Deleted
